# Patient Record
Sex: Male | Born: 1990 | Race: Black or African American | Hispanic: No | Marital: Single | Smoking: Former smoker
Health system: Southern US, Community
[De-identification: ages and names within clinical notes are randomized; demographics above are authoritative.]

## PROBLEM LIST (undated history)

## (undated) HISTORY — PX: ANKLE SURGERY: SHX546

---
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
## 2010-01-27 ENCOUNTER — Emergency Department (HOSPITAL_COMMUNITY): Admission: EM | Admit: 2010-01-27 | Discharge: 2010-01-27 | Payer: Self-pay | Admitting: Family Medicine

## 2010-03-28 ENCOUNTER — Emergency Department (HOSPITAL_COMMUNITY): Admission: EM | Admit: 2010-03-28 | Discharge: 2010-03-28 | Payer: Self-pay | Admitting: Family Medicine

## 2010-11-05 ENCOUNTER — Emergency Department (HOSPITAL_COMMUNITY)
Admission: EM | Admit: 2010-11-05 | Discharge: 2010-11-05 | Disposition: A | Discharge status: Home / Self Care | Payer: Worker's Compensation | Attending: Emergency Medicine | Admitting: Emergency Medicine

## 2010-11-05 ENCOUNTER — Emergency Department (HOSPITAL_COMMUNITY): Payer: Worker's Compensation

## 2010-11-05 DIAGNOSIS — W268XXA Contact with other sharp object(s), not elsewhere classified, initial encounter: Secondary | ICD-10-CM | POA: Insufficient documentation

## 2010-11-05 DIAGNOSIS — Y9269 Other specified industrial and construction area as the place of occurrence of the external cause: Secondary | ICD-10-CM | POA: Insufficient documentation

## 2010-11-05 DIAGNOSIS — S91309A Unspecified open wound, unspecified foot, initial encounter: Secondary | ICD-10-CM | POA: Insufficient documentation

## 2010-11-05 DIAGNOSIS — Y990 Civilian activity done for income or pay: Secondary | ICD-10-CM | POA: Insufficient documentation

## 2010-11-10 LAB — GC/CHLAMYDIA PROBE AMP, GENITAL: Chlamydia, DNA Probe: POSITIVE — AB

## 2010-11-16 ENCOUNTER — Emergency Department (HOSPITAL_COMMUNITY)
Admission: EM | Admit: 2010-11-16 | Discharge: 2010-11-16 | Disposition: A | Discharge status: Home / Self Care | Payer: Worker's Compensation | Attending: Emergency Medicine | Admitting: Emergency Medicine

## 2010-11-16 DIAGNOSIS — Z4802 Encounter for removal of sutures: Secondary | ICD-10-CM | POA: Insufficient documentation

## 2011-01-12 NOTE — Op Note (Signed)
NAMEGERRALD, BASU NO.:  [id number]   MEDICAL RECORD NO.:  [id number]          PATIENT TYPE:  OIB   LOCATION:  2854                         FACILITY:  [facility]   PHYSICIAN:  Vanita Panda. Magnus Ivan, M.D.DATE OF BIRTH:  11-30-1990   DATE OF PROCEDURE:  10/03/2004  DATE OF DISCHARGE:                                 OPERATIVE REPORT   PREOPERATIVE DIAGNOSIS:  Left ankle Tillaux fracture.   POSTOPERATIVE DIAGNOSIS:  Left ankle Tillaux fracture.   PROCEDURE:  Internal reduction, internal fixation of left ankle Tillaux  fracture.   SURGEON:  Vanita Panda. Magnus Ivan, M.D.   ANESTHESIA:  General.   ANTIBIOTICS:  IV Kefzol 1 g.   ESTIMATED BLOOD LOSS:  Minimal.   TOURNIQUET TIME:  One hour and 11 minutes.   COMPLICATIONS:  None.   INDICATIONS FOR PROCEDURE:  Briefly, the patient is a 20 year old who last  Thursday was playing ball at school.  He slid into a base and sustained an  injury to his left ankle.  After continued pain in that ankle during the  day, he was seen in the emergency room that evening and found to have a left  ankle Tillaux fracture.  This was a large anterior lateral piece, as well as  a minimally-displaced fibula fracture through the growth plate.  This  qualified as a Salter-Harris III fracture.  A CT scan was obtained to verify  the displacement, and there was considerable displacement of the articular  portion.  It was recommended that he undergo an open reduction and internal  fixation of this fracture.  The risks and benefits of this were explained to  Mom and the child, and they agreed to proceed with surgery.  In the  emergency room he was placed in a well-padded splint and given a followup  for surgery today.  He has been nonweightbearing.   DESCRIPTION OF PROCEDURE:  After an informed consent was obtained, the  patient was brought to the operating room and placed supine on the operating  room table.  General anesthesia was  obtained, and then a bump was placed  under his left hip, to allow the leg to internally rotate slightly.  The leg  was prepped and draped with Duraprep and sterile drapes.  An Esmarch was  then used to wrap out the leg, and the tourniquet was inflated to 300 mmHg.  An anterior lateral approach was made, and the skin incision made sharply  with a knife, and carried through the subcutaneous tissue.  Blunt-tipped  dissection scissors were then used to take me down to the level of the  fracture.  Careful retraction was made on the superficial peroneal nerve, as  well as identifying the tendinous and other neurovascular structures.  The  fracture itself was identified and the fracture fragments were cleaned  sharply with a knife.  Irrigation was used to cleanse the ankle of the  fracture hematoma.  The fracture fragment in its entirety could be seen, and  once gentle traction was pulled along the ___________ of the foot, and the  foot was put in a plantar-flexed  position, the fracture fragment was teased  into an anatomic position.  Two K-wires were then placed, traversing the  fracture.  One was put in a 90-degree angle to the fracture itself, and this  was for use of a 4.0 cannulated screw.  When this pin was found to be in  position adequately, fluoroscopy was utilized to show that the reduction was  concentric.  The screw size was measured 48 mm, and a cannulated drill was  used to drill over the pin.  A size 48 mm partially-threaded 4.0 cancellus  screw was then placed across the fracture site.  The fracture fragment was  able to be compressed into place.  Under direct visualization, it appeared  to be anatomic and under fluoroscopic guidance likewise it was anatomic.  All guide pins were then removed.  The wound was then copiously irrigated,  and the superficial tissue was closed with interrupted #2-0 Vicryl suture.  The skin was reapproximated with interrupted #2-0 nylon suture.   Sterile  dressings were applied, and then a well-padded plaster splint was applied as  well.  The tourniquet was deflated at one hour and 11 minutes.  The toes did  pink in nicely.   The patient was awakened, extubated, and taken to the recovery room in  stable condition.      CYB/MEDQ  D:  10/03/2004  T:  10/03/2004  Job:  [id number]

## 2011-01-12 NOTE — Consult Note (Signed)
NAMEJASAUN, CARN NO.:  [id number]   MEDICAL RECORD NO.:  [id number]          PATIENT TYPE:  EMS   LOCATION:  MAJO                         FACILITY:  [facility]   PHYSICIAN:  Vanita Panda. Magnus Ivan, M.D.DATE OF BIRTH:  1990-09-17   DATE OF CONSULTATION:  09/28/2004  DATE OF DISCHARGE:  09/28/2004                                   CONSULTATION   REASON FOR CONSULTATION:  Left ankle fracture.   HISTORY OF PRESENT ILLNESS:  Briefly, Ramesses is a 20 year old who while he  was at school today slid into a base during physical education.  He had  immediate pain in his left ankle with difficulty ambulating.  As the course  of the day went on, he continued to have difficulty ambulating.  He was  brought to urgent care by his mother.  X-rays were obtained and showed a  left ankle fracture with an injury through the growth plate.  He was sent  down to the [facility] Emergency Department for CT scan evaluation per my  recommendations once they gave me a call.  In the [facility] emergency room,  he described pain in his ankle with difficulty ambulating.  He denied any  other injuries.  He denied any knee pain.  He denied any numbness and  tingling in that left ankle other than pain and swelling.   PAST MEDICAL HISTORY:  None.   ALLERGIES:  No known drug allergies.   MEDICATIONS:  None.   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   REVIEW OF SYSTEMS:  Negative for chest pain, shortness of breath, nausea,  vomiting, fever or chills, only positive for his left ankle symptoms.   PHYSICAL EXAMINATION:  VITAL SIGNS:  Afebrile, vital signs stable.  GENERAL:  Alert and oriented male in no acute distress or obvious  discomfort.  HEENT:  Normocephalic and atraumatic.  Pupils equal, round and reactive to  light.  NECK:  No tenderness to palpation.  LUNGS:  Clear to auscultation bilaterally.  HEART:  Regular rate and rhythm.  ABDOMEN:  Soft and nontender,  nondistended.  Normal active bowel sounds.  EXTREMITIES:  Swelling over the anterior and lateral left ankle.  The skin  is intact. There is a moderate amount of swelling.  He has a palpable  dorsalis pedis and posterior tibial pulses and good capillary refill in his  toes.  He has normal sensation over his foot and only has pain with  swelling.  He does move his toes but declines to move ankle secondary to  pain.   X-rays were reviewed and show what appears to be a left ankle Tillaux  fracture.  This is a Marzetta Merino 3 fracture extending from the joint  through the epiphysis and out the lateral aspect.   A CT scan was obtained with reconstructions and did showed the  aforementioned Tillaux fracture. This was through the lateral growth plate  which was not yet fused. It does show fusion of his medial growth plate at  the distal ankle.  There is no other noted fragment  other than the fracture  at the physis of the distal fibula; however, this is minimally displaced.   ASSESSMENT:  This is a 20 year old with a left ankle Tillaux fracture.   PLAN:  I am going to place him in a well-padded short leg splint with the  posterior aspect in stirrups.  I will have him remain nonweightbearing  on  crutches.  I have explained to his mother and to him that this will need  surgical intervention due to the displaced intra-articular nature of the  fracture.  I have told him this will be likely through an anterolateral  approach with a single screw to reduce this piece.  I want him to keep his  ankle elevated at all times as much as possible over the weekend.  He will  follow up in my clinic Monday where we will set up surgery as an outpatient  some time Tuesday through Friday.  He was discharged on appropriate pain  medications orally and demonstrated the use of crutches.  Numbers were given  to get hold of my clinic for scheduling appointment.      CYB/MEDQ  D:  09/28/2004  T:  09/29/2004  Job:   [id number]

## 2011-05-02 ENCOUNTER — Emergency Department (HOSPITAL_COMMUNITY)
Admission: EM | Admit: 2011-05-02 | Discharge: 2011-05-03 | Disposition: A | Discharge status: Home / Self Care | Payer: Self-pay | Attending: Emergency Medicine | Admitting: Emergency Medicine

## 2011-05-02 ENCOUNTER — Emergency Department (HOSPITAL_COMMUNITY): Payer: Self-pay

## 2011-05-02 DIAGNOSIS — M545 Low back pain, unspecified: Secondary | ICD-10-CM | POA: Insufficient documentation

## 2011-05-02 DIAGNOSIS — M549 Dorsalgia, unspecified: Secondary | ICD-10-CM | POA: Insufficient documentation

## 2011-05-02 DIAGNOSIS — R0602 Shortness of breath: Secondary | ICD-10-CM | POA: Insufficient documentation

## 2011-05-02 DIAGNOSIS — T148XXA Other injury of unspecified body region, initial encounter: Secondary | ICD-10-CM | POA: Insufficient documentation

## 2011-05-02 DIAGNOSIS — X58XXXA Exposure to other specified factors, initial encounter: Secondary | ICD-10-CM | POA: Insufficient documentation

## 2011-05-05 ENCOUNTER — Emergency Department (HOSPITAL_COMMUNITY)
Admission: EM | Admit: 2011-05-05 | Discharge: 2011-05-05 | Disposition: A | Discharge status: Home / Self Care | Payer: Self-pay | Attending: Emergency Medicine | Admitting: Emergency Medicine

## 2011-05-05 DIAGNOSIS — X58XXXA Exposure to other specified factors, initial encounter: Secondary | ICD-10-CM | POA: Insufficient documentation

## 2011-05-05 DIAGNOSIS — M545 Low back pain, unspecified: Secondary | ICD-10-CM | POA: Insufficient documentation

## 2011-05-05 DIAGNOSIS — M62838 Other muscle spasm: Secondary | ICD-10-CM | POA: Insufficient documentation

## 2011-05-05 DIAGNOSIS — S335XXA Sprain of ligaments of lumbar spine, initial encounter: Secondary | ICD-10-CM | POA: Insufficient documentation

## 2011-05-05 LAB — URINALYSIS, ROUTINE W REFLEX MICROSCOPIC
Glucose, UA: NEGATIVE mg/dL
Protein, ur: NEGATIVE mg/dL
pH: 6 (ref 5.0–8.0)

## 2011-10-09 ENCOUNTER — Emergency Department (HOSPITAL_COMMUNITY)
Admission: EM | Admit: 2011-10-09 | Discharge: 2011-10-10 | Disposition: A | Discharge status: Home / Self Care | Payer: Self-pay | Attending: Emergency Medicine | Admitting: Emergency Medicine

## 2011-10-09 ENCOUNTER — Emergency Department (HOSPITAL_COMMUNITY): Payer: Self-pay

## 2011-10-09 ENCOUNTER — Encounter (HOSPITAL_COMMUNITY): Payer: Self-pay | Admitting: *Deleted

## 2011-10-09 DIAGNOSIS — X500XXA Overexertion from strenuous movement or load, initial encounter: Secondary | ICD-10-CM | POA: Insufficient documentation

## 2011-10-09 DIAGNOSIS — S82899A Other fracture of unspecified lower leg, initial encounter for closed fracture: Secondary | ICD-10-CM | POA: Insufficient documentation

## 2011-10-09 DIAGNOSIS — M25579 Pain in unspecified ankle and joints of unspecified foot: Secondary | ICD-10-CM | POA: Insufficient documentation

## 2011-10-09 NOTE — ED Notes (Signed)
Patient transported to X-ray 

## 2011-10-09 NOTE — ED Notes (Signed)
Patient injured his left ankle last night playing basket ball.  Patient was able to work out at [employer] today and tonight when he got in the bed it was swollen and pain was worse.  Has history of ankle fracture on that right ankle

## 2011-10-10 MED ORDER — IBUPROFEN 800 MG PO TABS: 800.0000 mg | ORAL_TABLET | Freq: Three times a day (TID) | ORAL | Status: AC

## 2011-10-10 MED ORDER — OXYCODONE-ACETAMINOPHEN 5-325 MG PO TABS: 2.0000 | ORAL_TABLET | ORAL | Status: AC | PRN

## 2011-10-10 NOTE — ED Provider Notes (Signed)
History     CSN: [id number]  Arrival date & time 10/09/11  2329   None     Chief Complaint  Patient presents with  . Ankle Pain    (Consider location/radiation/quality/duration/timing/severity/associated sxs/prior treatment) Patient is a 21 y.o. male presenting with ankle pain. The history is provided by the patient. No language interpreter was used.  Ankle Pain  The pain is present in the left ankle and left foot. The quality of the pain is described as throbbing. The pain is at a severity of 6/10. The pain is moderate. The pain has been constant since onset. Pertinent negatives include no numbness, no inability to bear weight, no loss of motion, no muscle weakness and no loss of sensation. The symptoms are aggravated by bearing weight. He has tried nothing for the symptoms.    History reviewed. No pertinent past medical history.  History reviewed. No pertinent past surgical history.  History reviewed. No pertinent family history.  History  Substance Use Topics  . Smoking status: Never Smoker   . Smokeless tobacco: Not on file  . Alcohol Use: No      Review of Systems  Neurological: Negative for numbness.  All other systems reviewed and are negative.    Allergies  Review of patient's allergies indicates no known allergies.  Home Medications  No current outpatient prescriptions on file.  BP 147/91  Pulse 93  Temp(Src) 98.8 F (37.1 C) (Oral)  Resp 20  SpO2 100%  Physical Exam  Nursing note and vitals reviewed. Constitutional: He is oriented to person, place, and time. He appears well-developed and well-nourished.  HENT:  Head: Normocephalic and atraumatic.  Eyes: Pupils are equal, round, and reactive to light.  Neck: Neck supple.  Cardiovascular: Normal rate and regular rhythm.  Exam reveals no gallop and no friction rub.   No murmur heard. Pulmonary/Chest: Breath sounds normal. No respiratory distress.  Abdominal: Soft. He exhibits no distension.    Musculoskeletal: Normal range of motion.       L ankle pain and swelling 2+ pedal pulse with good sensation.  Neurological: He is alert and oriented to person, place, and time. No cranial nerve deficit.  Skin: Skin is warm and dry.  Psychiatric: He has a normal mood and affect.    ED Course  Procedures (including critical care time)  Labs Reviewed - No data to display Dg Ankle Complete Left  10/10/2011  *RADIOLOGY REPORT*  Clinical Data: Lateral pain and swelling after twisting injury on 02/11.  LEFT ANKLE COMPLETE - 3+ VIEW  Comparison: Left foot 11/05/2010.  Left ankle 09/28/2004  Findings: Interval screw fixation of the distal left tibia with healing of previously demonstrated fractures.  Acute appearing fragment inferior to the lateral malleolus with associated soft tissue swelling suggesting avulsion fracture.  Left ankle otherwise appears intact.  No focal bone lesion or bone destruction.  No abnormal radiopaque foreign bodies in the soft tissue.  IMPRESSION: Acute appearing bone fragment inferior to the lateral malleolus with overlying soft tissue swelling consistent with avulsion fracture.  Original Report Authenticated By: Marlon Pel, M.D.     No diagnosis found.    MDM  Avulsion fx to interior lateral malleolus.  Cam walker provided with ortho follow up this week and pain meds.        Jethro Bastos, NP 10/12/11 1712

## 2011-10-10 NOTE — Discharge Instructions (Signed)
Ankle Fracture A fracture is a break in the bone. A cast or splint is used to protect and keep your injured bone from moving.  HOME CARE INSTRUCTIONS   Use your crutches as directed.   To lessen the swelling, keep the injured leg elevated while sitting or lying down.   Apply ice to the injury for 15 to 20 minutes, 3 to 4 times per day while awake for 2 days. Put the ice in a plastic bag and place a thin towel between the bag of ice and your cast.   If you have a plaster or fiberglass cast:   Do not try to scratch the skin under the cast using sharp or pointed objects.   Check the skin around the cast every day. You may put lotion on any red or sore areas.   Keep your cast dry and clean.   If you have a plaster splint:   Wear the splint as directed.   You may loosen the elastic around the splint if your toes become numb, tingle, or turn cold or blue.   Do not put pressure on any part of your cast or splint; it may break. Rest your cast only on a pillow the first 24 hours until it is fully hardened.   Your cast or splint can be protected during bathing with a plastic bag. Do not lower the cast or splint into water.   Take medications as directed by your caregiver. Only take over-the-counter or prescription medicines for pain, discomfort, or fever as directed by your caregiver.   Do not drive a vehicle until your caregiver specifically tells you it is safe to do so.   If your caregiver has given you a follow-up appointment, it is very important to keep that appointment. Not keeping the appointment could result in a chronic or permanent injury, pain, and disability. If there is any problem keeping the appointment, you must call back to this facility for assistance.  SEEK IMMEDIATE MEDICAL CARE IF:   Your cast gets damaged or breaks.   You have continued severe pain or more swelling than you did before the cast was put on.   Your skin or toenails below the injury turn blue or gray,  or feel cold or numb.   There is a bad smell or new stains and/or purulent (pus like) drainage coming from under the cast.  If you do not have a window in your cast for observing the wound, a discharge or minor bleeding may show up as a stain on the outside of your cast. Report these findings to your caregiver. MAKE SURE YOU:   Understand these instructions.   Will watch your condition.   Will get help right away if you are not doing well or get worse.  Document Released: 08/10/2000 Document Revised: 04/25/2011 Document Reviewed: 03/16/2008 [facility] Patient Information 2012 [place], [place].Ankle Fracture A fracture is a break in the bone. A cast or splint is used to protect and keep your injured bone from moving.  HOME CARE INSTRUCTIONS   Use your crutches as directed.   To lessen the swelling, keep the injured leg elevated while sitting or lying down.   Apply ice to the injury for 15 to 20 minutes, 3 to 4 times per day while awake for 2 days. Put the ice in a plastic bag and place a thin towel between the bag of ice and your cast.   If you have a plaster or fiberglass cast:  Do not try to scratch the skin under the cast using sharp or pointed objects.   Check the skin around the cast every day. You may put lotion on any red or sore areas.   Keep your cast dry and clean.   If you have a plaster splint:   Wear the splint as directed.   You may loosen the elastic around the splint if your toes become numb, tingle, or turn cold or blue.   Do not put pressure on any part of your cast or splint; it may break. Rest your cast only on a pillow the first 24 hours until it is fully hardened.   Your cast or splint can be protected during bathing with a plastic bag. Do not lower the cast or splint into water.   Take medications as directed by your caregiver. Only take over-the-counter or prescription medicines for pain, discomfort, or fever as directed by your caregiver.   Do not drive  a vehicle until your caregiver specifically tells you it is safe to do so.   If your caregiver has given you a follow-up appointment, it is very important to keep that appointment. Not keeping the appointment could result in a chronic or permanent injury, pain, and disability. If there is any problem keeping the appointment, you must call back to this facility for assistance.  SEEK IMMEDIATE MEDICAL CARE IF:   Your cast gets damaged or breaks.   You have continued severe pain or more swelling than you did before the cast was put on.   Your skin or toenails below the injury turn blue or gray, or feel cold or numb.   There is a bad smell or new stains and/or purulent (pus like) drainage coming from under the cast.  If you do not have a window in your cast for observing the wound, a discharge or minor bleeding may show up as a stain on the outside of your cast. Report these findings to your caregiver. MAKE SURE YOU:   Understand these instructions.   Will watch your condition.   Will get help right away if you are not doing well or get worse.  Document Released: 08/10/2000 Document Revised: 04/25/2011 Document Reviewed: 03/16/2008 [facility] Patient Information 2012 [place], [place].

## 2011-10-13 NOTE — ED Provider Notes (Signed)
Medical screening examination/treatment/procedure(s) were performed by non-physician practitioner and as supervising physician I was immediately available for consultation/collaboration.   Nika Yazzie, MD 10/13/11 0129 

## 2012-06-22 ENCOUNTER — Emergency Department (HOSPITAL_COMMUNITY)
Admission: EM | Admit: 2012-06-22 | Discharge: 2012-06-22 | Disposition: A | Discharge status: Home / Self Care | Payer: Self-pay | Attending: Emergency Medicine | Admitting: Emergency Medicine

## 2012-06-22 ENCOUNTER — Encounter (HOSPITAL_COMMUNITY): Payer: Self-pay | Admitting: *Deleted

## 2012-06-22 DIAGNOSIS — K089 Disorder of teeth and supporting structures, unspecified: Secondary | ICD-10-CM | POA: Insufficient documentation

## 2012-06-22 DIAGNOSIS — K0889 Other specified disorders of teeth and supporting structures: Secondary | ICD-10-CM

## 2012-06-22 MED ORDER — PENICILLIN V POTASSIUM 500 MG PO TABS: 500.0000 mg | ORAL_TABLET | Freq: Four times a day (QID) | ORAL | Status: DC

## 2012-06-22 NOTE — ED Notes (Signed)
Pt reports that he had a toothache this afternoon and went to take Ibuprofen; pt believed that he was taking 8 100mg  tablets but after taking them realized that they were 200mg  tablets; pt denies suicidal ideation; denies N/V/D.

## 2012-06-22 NOTE — ED Provider Notes (Signed)
Medical screening examination/treatment/procedure(s) were performed by non-physician practitioner and as supervising physician I was immediately available for consultation/collaboration.  Loretta Kluender T Jesselle Laflamme, MD 06/22/12 2239 

## 2012-06-22 NOTE — ED Provider Notes (Signed)
History     CSN: [id number]  Arrival date & time 06/22/12  2058   First MD Initiated Contact with Patient 06/22/12 2137      Chief Complaint  Patient presents with  . Drug Overdose    (Consider location/radiation/quality/duration/timing/severity/associated sxs/prior treatment) HPI Comments: This is a 21 year old male, who presents to the emergency department with chief complaint of tooth pain. Additionally, the patient states that he took too much ibuprofen. He says that he thought he was taking 100 mg tablets, but that he is actually taking 200 mg tablets for a total of 1600 mg. Patient does not have any nausea, vomiting, abdominal pain. He is asymptomatic in general except for his tooth pain. He states that his tooth pain is moderate. The pain does not radiate.  The history is provided by the patient. No language interpreter was used.    History reviewed. No pertinent past medical history.  Past Surgical History  Procedure Date  . Ankle surgery     No family history on file.  History  Substance Use Topics  . Smoking status: Never Smoker   . Smokeless tobacco: Not on file  . Alcohol Use: No      Review of Systems  HENT:       Toothache  Gastrointestinal: Negative for nausea, vomiting, abdominal pain, diarrhea, constipation, blood in stool, abdominal distention, anal bleeding and rectal pain.  All other systems reviewed and are negative.    Allergies  Review of patient's allergies indicates no known allergies.  Home Medications  No current outpatient prescriptions on file.  BP 146/79  Pulse 99  Temp 98.5 F (36.9 C) (Oral)  Resp 16  Ht 6\' 1"  (1.854 m)  Wt 278 lb 3.2 oz (126.191 kg)  BMI 36.70 kg/m2  SpO2 100%  Physical Exam  Nursing note and vitals reviewed. Constitutional: He is oriented to person, place, and time. He appears well-developed and well-nourished.  HENT:  Head: Normocephalic and atraumatic.  Eyes: Conjunctivae normal and EOM are  normal. Pupils are equal, round, and reactive to light.  Neck: Normal range of motion. Neck supple.  Cardiovascular: Normal rate, regular rhythm and normal heart sounds.   Pulmonary/Chest: Effort normal and breath sounds normal.  Abdominal: Soft. Bowel sounds are normal.  Musculoskeletal: Normal range of motion.  Neurological: He is alert and oriented to person, place, and time.  Skin: Skin is warm and dry.  Psychiatric: He has a normal mood and affect. His behavior is normal. Judgment and thought content normal.    ED Course  Procedures (including critical care time)  Labs Reviewed - No data to display No results found.   1. Toothache       MDM  21 year old male with toothache. I'm going to discharge the patient with penicillin and instructions to followup with a dentist. The patient also ingested 1600 mg of ibuprofen, but I'm not concerned about toxicity based on the patient's weight and the amount of ibuprofen the was ingested. The patient is stable and ready for discharge.        Roxy Horseman, PA-C 06/22/12 2150

## 2013-09-28 ENCOUNTER — Emergency Department (HOSPITAL_COMMUNITY)
Admission: EM | Admit: 2013-09-28 | Discharge: 2013-09-28 | Discharge status: Against Medical Advice | Payer: Worker's Compensation | Source: Home / Self Care

## 2013-09-28 NOTE — ED Notes (Signed)
Presumed to have left UCC w/o exam when unable to locate pt at 18:19

## 2013-11-27 ENCOUNTER — Emergency Department (HOSPITAL_COMMUNITY)
Admission: EM | Admit: 2013-11-27 | Discharge: 2013-11-27 | Disposition: A | Discharge status: Home / Self Care | Payer: Worker's Compensation | Attending: Emergency Medicine | Admitting: Emergency Medicine

## 2013-11-27 ENCOUNTER — Encounter (HOSPITAL_COMMUNITY): Payer: Self-pay | Admitting: Emergency Medicine

## 2013-11-27 DIAGNOSIS — W1809XA Striking against other object with subsequent fall, initial encounter: Secondary | ICD-10-CM | POA: Insufficient documentation

## 2013-11-27 DIAGNOSIS — IMO0002 Reserved for concepts with insufficient information to code with codable children: Secondary | ICD-10-CM | POA: Insufficient documentation

## 2013-11-27 DIAGNOSIS — Y990 Civilian activity done for income or pay: Secondary | ICD-10-CM | POA: Insufficient documentation

## 2013-11-27 DIAGNOSIS — X500XXA Overexertion from strenuous movement or load, initial encounter: Secondary | ICD-10-CM | POA: Insufficient documentation

## 2013-11-27 DIAGNOSIS — S93409A Sprain of unspecified ligament of unspecified ankle, initial encounter: Secondary | ICD-10-CM | POA: Insufficient documentation

## 2013-11-27 DIAGNOSIS — Y9229 Other specified public building as the place of occurrence of the external cause: Secondary | ICD-10-CM | POA: Insufficient documentation

## 2013-11-27 DIAGNOSIS — Y9389 Activity, other specified: Secondary | ICD-10-CM | POA: Insufficient documentation

## 2013-11-27 DIAGNOSIS — S93402A Sprain of unspecified ligament of left ankle, initial encounter: Secondary | ICD-10-CM

## 2013-11-27 MED ORDER — IBUPROFEN 800 MG PO TABS: 800.0000 mg | ORAL_TABLET | Freq: Three times a day (TID) | ORAL | Status: DC

## 2013-11-27 MED ORDER — METHOCARBAMOL 500 MG PO TABS: 500.0000 mg | ORAL_TABLET | Freq: Two times a day (BID) | ORAL | Status: DC

## 2013-11-27 NOTE — Progress Notes (Signed)
Orthopedic Tech Progress Note Patient Details:  Keith Foster 11/24/1990 [id number]  Ortho Devices Type of Ortho Device: ASO Ortho Device/Splint Location: Keith Foster   Keith Foster 11/27/2013, 10:17 PM

## 2013-11-27 NOTE — ED Notes (Signed)
Pt. reports injury to left ankle at work this morning ( [employer]) , slipped on icy floor inside freezer and twisted his left ankle with superficial laceration approx. 1 inch at left shin . Ambulatory.

## 2013-11-27 NOTE — Discharge Instructions (Signed)
Acute Ankle Sprain  with Phase I Rehab  An acute ankle sprain is a partial or complete tear in one or more of the ligaments of the ankle due to traumatic injury. The severity of the injury depends on both the the number of ligaments sprained and the grade of sprain. There are 3 grades of sprains.   · A grade 1 sprain is a mild sprain. There is a slight pull without obvious tearing. There is no loss of strength, and the muscle and ligament are the correct length.  · A grade 2 sprain is a moderate sprain. There is tearing of fibers within the substance of the ligament where it connects two bones or two cartilages. The length of the ligament is increased, and there is usually decreased strength.  · A grade 3 sprain is a complete rupture of the ligament and is uncommon.  In addition to the grade of sprain, there are three types of ankle sprains.   Lateral ankle sprains: This is a sprain of one or more of the three ligaments on the outer side (lateral) of the ankle. These are the most common sprains.  Medial ankle sprains: There is one large triangular ligament of the inner side (medial) of the ankle that is susceptible to injury. Medial ankle sprains are less common.  Syndesmosis, "high ankle," sprains: The syndesmosis is the ligament that connects the two bones of the lower leg. Syndesmosis sprains usually only occur with very severe ankle sprains.  SYMPTOMS  · Pain, tenderness, and swelling in the ankle, starting at the side of injury that may progress to the whole ankle and foot with time.  · "Pop" or tearing sensation at the time of injury.  · Bruising that may spread to the heel.  · Impaired ability to walk soon after injury.  CAUSES   · Acute ankle sprains are caused by trauma placed on the ankle that temporarily forces or pries the anklebone (talus) out of its normal socket.  · Stretching or tearing of the ligaments that normally hold the joint in place (usually due to a twisting injury).  RISK INCREASES  WITH:  · Previous ankle sprain.  · Sports in which the foot may land awkwardly (ie. basketball, volleyball, or soccer) or walking or running on uneven or rough surfaces.  · Shoes with inadequate support to prevent sideways motion when stress occurs.  · Poor strength and flexibility.  · Poor balance skills.  · Contact sports.  PREVENTION   · Warm up and stretch properly before activity.  · Maintain physical fitness:  · Ankle and leg flexibility, muscle strength, and endurance.  · Cardiovascular fitness.  · Balance training activities.  · Use proper technique and have a coach correct improper technique.  · Taping, protective strapping, bracing, or high-top tennis shoes may help prevent injury. Initially, tape is best; however, it loses most of its support function within 10 to 15 minutes.  · Wear proper fitted protective shoes (High-top shoes with taping or bracing is more effective than either alone).  · Provide the ankle with support during sports and practice activities for 12 months following injury.  PROGNOSIS   · If treated properly, ankle sprains can be expected to recover completely; however, the length of recovery depends on the degree of injury.  · A grade 1 sprain usually heals enough in 5 to 7 days to allow modified activity and requires an average of 6 weeks to heal completely.  · A grade 2 sprain requires   6 to 10 weeks to heal completely.  · A grade 3 sprain requires 12 to 16 weeks to heal.  · A syndesmosis sprain often takes more than 3 months to heal.  RELATED COMPLICATIONS   · Frequent recurrence of symptoms may result in a chronic problem. Appropriately addressing the problem the first time decreases the frequency of recurrence and optimizes healing time. Severity of the initial sprain does not predict the likelihood of later instability.  · Injury to other structures (bone, cartilage, or tendon).  · A chronically unstable or arthritic ankle joint is a possiblity with repeated  sprains.  TREATMENT  Treatment initially involves the use of ice, medication, and compression bandages to help reduce pain and inflammation. Ankle sprains are usually immobilized in a walking cast or boot to allow for healing. Crutches may be recommended to reduce pressure on the injury. After immobilization, strengthening and stretching exercises may be necessary to regain strength and a full range of motion. Surgery is rarely needed to treat ankle sprains.  MEDICATION   · Nonsteroidal anti-inflammatory medications, such as aspirin and ibuprofen (do not take for the first 3 days after injury or within 7 days before surgery), or other minor pain relievers, such as acetaminophen, are often recommended. Take these as directed by your caregiver. Contact your caregiver immediately if any bleeding, stomach upset, or signs of an allergic reaction occur from these medications.  · Ointments applied to the skin may be helpful.  · Pain relievers may be prescribed as necessary by your caregiver. Do not take prescription pain medication for longer than 4 to 7 days. Use only as directed and only as much as you need.  HEAT AND COLD  · Cold treatment (icing) is used to relieve pain and reduce inflammation for acute and chronic cases. Cold should be applied for 10 to 15 minutes every 2 to 3 hours for inflammation and pain and immediately after any activity that aggravates your symptoms. Use ice packs or an ice massage.  · Heat treatment may be used before performing stretching and strengthening activities prescribed by your caregiver. Use a heat pack or a warm soak.  SEEK IMMEDIATE MEDICAL CARE IF:   · Pain, swelling, or bruising worsens despite treatment.  · You experience pain, numbness, discoloration, or coldness in the foot or toes.  · New, unexplained symptoms develop (drugs used in treatment may produce side effects.)  EXERCISES   PHASE I EXERCISES  RANGE OF MOTION (ROM) AND STRETCHING EXERCISES - Ankle Sprain, Acute Phase I,  Weeks 1 to 2  These exercises may help you when beginning to restore flexibility in your ankle. You will likely work on these exercises for the 1 to 2 weeks after your injury. Once your physician, physical therapist, or athletic trainer sees adequate progress, he or she will advance your exercises. While completing these exercises, remember:   · Restoring tissue flexibility helps normal motion to return to the joints. This allows healthier, less painful movement and activity.  · An effective stretch should be held for at least 30 seconds.  · A stretch should never be painful. You should only feel a gentle lengthening or release in the stretched tissue.  RANGE OF MOTION - Dorsi/Plantar Flexion  · While sitting with your right / left knee straight, draw the top of your foot upwards by flexing your ankle. Then reverse the motion, pointing your toes downward.  · Hold each position for __________ seconds.  · After completing your first set of   exercises, repeat this exercise with your knee bent.  Repeat __________ times. Complete this exercise __________ times per day.   RANGE OF MOTION - Ankle Alphabet  · Imagine your right / left big toe is a pen.  · Keeping your hip and knee still, write out the entire alphabet with your "pen." Make the letters as large as you can without increasing any discomfort.  Repeat __________ times. Complete this exercise __________ times per day.   STRENGTHENING EXERCISES - Ankle Sprain, Acute -Phase I, Weeks 1 to 2  These exercises may help you when beginning to restore strength in your ankle. You will likely work on these exercises for 1 to 2 weeks after your injury. Once your physician, physical therapist, or athletic trainer sees adequate progress, he or she will advance your exercises. While completing these exercises, remember:   · Muscles can gain both the endurance and the strength needed for everyday activities through controlled exercises.  · Complete these exercises as instructed by  your physician, physical therapist, or athletic trainer. Progress the resistance and repetitions only as guided.  · You may experience muscle soreness or fatigue, but the pain or discomfort you are trying to eliminate should never worsen during these exercises. If this pain does worsen, stop and make certain you are following the directions exactly. If the pain is still present after adjustments, discontinue the exercise until you can discuss the trouble with your clinician.  STRENGTH - Dorsiflexors  · Secure a rubber exercise band/tubing to a fixed object (ie. table, pole) and loop the other end around your right / left foot.  · Sit on the floor facing the fixed object. The band/tubing should be slightly tense when your foot is relaxed.  · Slowly draw your foot back toward you using your ankle and toes.  · Hold this position for __________ seconds. Slowly release the tension in the band and return your foot to the starting position.  Repeat __________ times. Complete this exercise __________ times per day.   STRENGTH - Plantar-flexors   · Sit with your right / left leg extended. Holding onto both ends of a rubber exercise band/tubing, loop it around the ball of your foot. Keep a slight tension in the band.  · Slowly push your toes away from you, pointing them downward.  · Hold this position for __________ seconds. Return slowly, controlling the tension in the band/tubing.  Repeat __________ times. Complete this exercise __________ times per day.   STRENGTH - Ankle Eversion  · Secure one end of a rubber exercise band/tubing to a fixed object (table, pole). Loop the other end around your foot just before your toes.  · Place your fists between your knees. This will focus your strengthening at your ankle.  · Drawing the band/tubing across your opposite foot, slowly, pull your little toe out and up. Make sure the band/tubing is positioned to resist the entire motion.  · Hold this position for __________ seconds.  Have  your muscles resist the band/tubing as it slowly pulls your foot back to the starting position.   Repeat __________ times. Complete this exercise __________ times per day.   STRENGTH - Ankle Inversion  · Secure one end of a rubber exercise band/tubing to a fixed object (table, pole). Loop the other end around your foot just before your toes.  · Place your fists between your knees. This will focus your strengthening at your ankle.  · Slowly, pull your big toe up and in, making   sure the band/tubing is positioned to resist the entire motion.  · Hold this position for __________ seconds.  · Have your muscles resist the band/tubing as it slowly pulls your foot back to the starting position.  Repeat __________ times. Complete this exercises __________ times per day.   STRENGTH - Towel Curls  · Sit in a chair positioned on a non-carpeted surface.  · Place your right / left foot on a towel, keeping your heel on the floor.  · Pull the towel toward your heel by only curling your toes. Keep your heel on the floor.  · If instructed by your physician, physical therapist, or athletic trainer, add weight to the end of the towel.  Repeat __________ times. Complete this exercise __________ times per day.  Document Released: 03/14/2005 Document Revised: 11/05/2011 Document Reviewed: 11/25/2008  [employer]® Patient Information ©2014 [employer].

## 2013-11-27 NOTE — ED Provider Notes (Signed)
CSN: [id number]     Arrival date & time 11/27/13  2047 History  This chart was scribed for Keith HelperBowie Donnesha Karg, PA-C, working with Junius ArgyleForrest S Harrison, MD, by [facility] ED Scribe. This patient was seen in room TR07C/TR07C and the patient's care was started at 9:39 PM.   Chief Complaint  Patient presents with  . Ankle Pain    The history is provided by the patient. No language interpreter was used.    HPI Comments: Keith Foster is a 23 y.o. Male with a prior history of left ankle surgery who presents to the Emergency Department complaining of a left ankle injury that occurred about 12 hours ago. Pt states that he slipped on an icy floor in a freezer and twisted his left ankle. He states that he had to stay at work after the injury and was on the foot for about 4 hours after the injury. He states that his pain is worsened with walking and weightbearing. He also states that he hit his left shin upon falling, and that he sustained a small laceration to his anterior left shin upon falling. He states that his last Tetanus vaccination was about 2 years ago. He reports that he did not sustain any other injuries at the time of the fall. He denies knee or hip pain or any other symptoms.   History reviewed. No pertinent past medical history. Past Surgical History  Procedure Laterality Date  . Ankle surgery     No family history on file. History  Substance Use Topics  . Smoking status: Never Smoker   . Smokeless tobacco: Not on file  . Alcohol Use: No    Review of Systems  Musculoskeletal: Positive for arthralgias (left ankle).       Denies hip or knee pain  Skin: Positive for wound.  Neurological: Negative for syncope and headaches.  All other systems reviewed and are negative.   Allergies  Review of patient's allergies indicates no known allergies.  Home Medications   Current Outpatient Rx  Name  Route  Sig  Dispense  Refill  . ibuprofen (ADVIL,MOTRIN) 800 MG tablet   Oral   Take 1  tablet (800 mg total) by mouth 3 (three) times daily.   21 tablet   0   . methocarbamol (ROBAXIN) 500 MG tablet   Oral   Take 1 tablet (500 mg total) by mouth 2 (two) times daily.   20 tablet   0     Triage Vitals: BP 143/81  Pulse 105  Temp(Src) 98 F (36.7 C) (Oral)  Resp 18  SpO2 98%  Physical Exam  Nursing note and vitals reviewed. Constitutional: He is oriented to person, place, and time. He appears well-developed and well-nourished. No distress.  HENT:  Head: Normocephalic and atraumatic.  Eyes: EOM are normal.  Neck: Neck supple. No tracheal deviation present.  Cardiovascular: Normal rate and intact distal pulses.   Distal pulses intact with brisk cap refill.  Pulmonary/Chest: Effort normal. No respiratory distress.  Musculoskeletal: Normal range of motion. He exhibits edema and tenderness.  Left foot: Tenderness to proximal dorsum of foot with palpation. Mild edema noted.  Pain with ankle ROM, but maintains full ROM.  Neurological: He is alert and oriented to person, place, and time.  Skin: Skin is warm and dry.  Left lower leg: a 2 cm superficial abrasion noted to the mid anterior tib/fib. No deformity. Minimal tenderness to palpation.   Psychiatric: He has a normal mood and affect. His behavior  is normal.    ED Course  Procedures (including critical care time)  DIAGNOSTIC STUDIES: Oxygen Saturation is 98% on RA, normal by my interpretation.    COORDINATION OF CARE: 9:45 PM- Discussed clinical suspicion that pt sprained his ankle. Will give pt an ankle brace, high dose ibuprofen and a muscle relaxant. Also advised pt of plan for at home care including elevating his ankle. Will also give pt a work note. Pt advised of plan for treatment and pt agrees.  Labs Review Labs Reviewed - No data to display Imaging Review No results found.   EKG Interpretation None      MDM   Final diagnoses:  Left ankle sprain    BP 143/81  Pulse 105  Temp(Src) 98 F  (36.7 C) (Oral)  Resp 18  SpO2 98%   I personally performed the services described in this documentation, which was scribed in my presence. The recorded information has been reviewed and is accurate.    Keith Helper, PA-C 11/27/13 2151

## 2013-11-27 NOTE — ED Notes (Signed)
Pt st's while at work this am he stepped on a rack that slid backwards causing him to turn his ankle and hit his lower leg on the rack.

## 2013-11-28 NOTE — ED Provider Notes (Signed)
Medical screening examination/treatment/procedure(s) were performed by non-physician practitioner and as supervising physician I was immediately available for consultation/collaboration.   EKG Interpretation None        Nicolemarie Wooley S Hitomi Slape, MD 11/28/13 1245 

## 2014-11-19 ENCOUNTER — Emergency Department (HOSPITAL_COMMUNITY)
Admission: EM | Admit: 2014-11-19 | Discharge: 2014-11-19 | Disposition: A | Discharge status: Home / Self Care | Payer: Worker's Compensation | Attending: Emergency Medicine | Admitting: Emergency Medicine

## 2014-11-19 ENCOUNTER — Encounter (HOSPITAL_COMMUNITY): Payer: Self-pay | Admitting: *Deleted

## 2014-11-19 DIAGNOSIS — M545 Low back pain, unspecified: Secondary | ICD-10-CM

## 2014-11-19 DIAGNOSIS — Z791 Long term (current) use of non-steroidal anti-inflammatories (NSAID): Secondary | ICD-10-CM | POA: Insufficient documentation

## 2014-11-19 DIAGNOSIS — Z79899 Other long term (current) drug therapy: Secondary | ICD-10-CM | POA: Insufficient documentation

## 2014-11-19 MED ORDER — HYDROCODONE-ACETAMINOPHEN 5-325 MG PO TABS
2.0000 | ORAL_TABLET | Freq: Once | ORAL | Status: AC
  Administered 2014-11-19: 2 via ORAL
  Filled 2014-11-19: qty 2

## 2014-11-19 MED ORDER — IBUPROFEN 800 MG PO TABS
800.0000 mg | ORAL_TABLET | Freq: Once | ORAL | Status: AC
  Administered 2014-11-19: 800 mg via ORAL
  Filled 2014-11-19: qty 1

## 2014-11-19 MED ORDER — IBUPROFEN 800 MG PO TABS: 800.0000 mg | ORAL_TABLET | Freq: Three times a day (TID) | ORAL | Status: DC

## 2014-11-19 NOTE — Discharge Instructions (Signed)
Back Injury Prevention Mr. Keith Foster, Continue to take motrin at home as directed for pain and use ice packs as well.  Do not do heavy lifting greater than 30 pounds until your back heals.  Follow up with a primary physician within 3 days for continued management.  If symptoms worsen, come back to the ED immediately. Thank you.  The following tips can help you to prevent a back injury. PHYSICAL FITNESS  Exercise often. Try to develop strong stomach (abdominal) muscles.  Do aerobic exercises often. This includes walking, jogging, biking, swimming.  Do exercises that help with balance and strength often. This includes tai chi and yoga.  Stretch before and after you exercise.  Keep a healthy weight. DIET   Ask your doctor how much calcium and vitamin D you need every day.  Include calcium in your diet. Foods high in calcium include dairy products; green, leafy vegetables; and products with calcium added (fortified).  Include vitamin D in your diet. Foods high in vitamin D include milk and products with vitamin D added.  Think about taking a multivitamin or other nutritional products called " supplements."  Stop smoking if you smoke. POSTURE   Sit and stand up straight. Avoid leaning forward or hunching over.  Choose chairs that support your lower back.  If you work at a desk:  Sit close to your work so you do not lean over.  Keep your chin tucked in.  Keep your neck drawn back.  Keep your elbows bent at a right angle. Your arms should look like the letter "L."  Sit high and close to the steering wheel when you drive. Add low back support to your car seat if needed.  Avoid sitting or standing in one position for too long. Get up and move around every hour. Take breaks if you are driving for a long time.  Sleep on your side with your knees slightly bent. You can also sleep on your back with a pillow under your knees. Do not sleep on your stomach. LIFTING, TWISTING, AND  REACHING  Avoid heavy lifting, especially lifting over and over again. If you must do heavy lifting:  Stretch before lifting.  Work slowly.  Rest between lifts.  Use carts and dollies to move objects when possible.  Make several small trips instead of carrying 1 heavy load.  Ask for help when you need it.  Ask for help when moving big, awkward objects.  Follow these steps when lifting:  Stand with your feet shoulder-width apart.  Get as close to the object as you can. Do not pick up heavy objects that are far from your body.  Use handles or lifting straps when possible.  Bend at your knees. Squat down, but keep your heels off the floor.  Keep your shoulders back, your chin tucked in, and your back straight.  Lift the object slowly. Tighten the muscles in your legs, stomach, and butt. Keep the object as close to the center of your body as possible.  Reverse these directions when you put a load down.  Do not:  Lift the object above your waist.  Twist at the waist while lifting or carrying a load. Move your feet if you need to turn, not your waist.  Bend over without bending at your knees.  Avoid reaching over your head, across a table, or for an object on a high surface. OTHER TIPS  Avoid wet floors and keep sidewalks clear of ice.  Do not sleep on  a mattress that is too soft or too hard.  Keep items that you use often within easy reach.  Put heavier objects on shelves at waist level. Put lighter objects on lower or higher shelves.  Find ways to lessen your stress. You can try exercise, massage, or relaxation.  Get help for depression or anxiety if needed. GET HELP IF:  You injure your back.  You have questions about diet, exercise, or other ways to prevent back injuries. MAKE SURE YOU:  Understand these instructions.  Will watch your condition.  Will get help right away if you are not doing well or get worse. Document Released: 01/30/2008 Document  Revised: 11/05/2011 Document Reviewed: 09/24/2011 [facility] Patient Information 2015 [place], [place]. This information is not intended to replace advice given to you by your health care provider. Make sure you discuss any questions you have with your health care provider.

## 2014-11-19 NOTE — ED Notes (Signed)
Pt c/o left lower back pain 8/10. Pt denies any urinary symptoms.

## 2014-11-19 NOTE — ED Provider Notes (Signed)
CSN: [id number]     Arrival date & time 11/19/14  0304 History  This chart was scribed for Tomasita Crumble, MD by Evon Slack, ED Scribe. This patient was seen in room A03C/A03C and the patient's care was started at 3:56 AM.    Chief Complaint  Patient presents with  . Back Pain   The history is provided by the patient. No language interpreter was used.   HPI Comments: Keith Foster is a 24 y.o. male who presents to the Emergency Department complaining of worsening low back pain onset 4 days prior. Pt states that 4 days ago he slept on his mattress wrong. He states that his back was half way off the mattress the entire tonight. The The patient was doing heavy lifting at the gym with his arms and noticed that is making his back pain worse as well. Pt states that certain movements make the pain worse. Pt doesn't report any medications PTA. Pt states that he has had similar pain 2 years prior that was relieved with muscle relaxer's. Pt denies fever or urinary symptoms.   History reviewed. No pertinent past medical history. Past Surgical History  Procedure Laterality Date  . Ankle surgery     No family history on file. History  Substance Use Topics  . Smoking status: Never Smoker   . Smokeless tobacco: Not on file  . Alcohol Use: No    Review of Systems  Constitutional: Negative for fever.  Genitourinary: Negative.   Musculoskeletal: Positive for back pain.  All other systems reviewed and are negative.     Allergies  Review of patient's allergies indicates no known allergies.  Home Medications   Prior to Admission medications   Medication Sig Start Date End Date Taking? Authorizing Provider  ibuprofen (ADVIL,MOTRIN) 800 MG tablet Take 1 tablet (800 mg total) by mouth 3 (three) times daily. 11/27/13   Fayrene Helper, PA-C  methocarbamol (ROBAXIN) 500 MG tablet Take 1 tablet (500 mg total) by mouth 2 (two) times daily. 11/27/13   Fayrene Helper, PA-C   BP 132/74 mmHg  Pulse 81   Temp(Src) 98.4 F (36.9 C) (Oral)  Resp 18  Ht 6' (1.829 m)  Wt 275 lb (124.739 kg)  BMI 37.29 kg/m2  SpO2 98%   Physical Exam  Constitutional: He is oriented to person, place, and time. Vital signs are normal. He appears well-developed and well-nourished.  Non-toxic appearance. He does not appear ill. No distress.  HENT:  Head: Normocephalic and atraumatic.  Nose: Nose normal.  Mouth/Throat: Oropharynx is clear and moist. No oropharyngeal exudate.  Eyes: Conjunctivae and EOM are normal. Pupils are equal, round, and reactive to light. No scleral icterus.  Neck: Normal range of motion. Neck supple. No tracheal deviation, no edema, no erythema and normal range of motion present. No thyroid mass and no thyromegaly present.  Cardiovascular: Normal rate, regular rhythm, S1 normal, S2 normal, normal heart sounds, intact distal pulses and normal pulses.  Exam reveals no gallop and no friction rub.   No murmur heard. Pulses:      Radial pulses are 2+ on the right side, and 2+ on the left side.       Dorsalis pedis pulses are 2+ on the right side, and 2+ on the left side.  Pulmonary/Chest: Effort normal and breath sounds normal. No respiratory distress. He has no wheezes. He has no rhonchi. He has no rales.  Abdominal: Soft. Normal appearance and bowel sounds are normal. He exhibits no distension, no ascites  and no mass. There is no hepatosplenomegaly. There is no tenderness. There is no rebound, no guarding and no CVA tenderness.  Musculoskeletal: Normal range of motion. He exhibits tenderness. He exhibits no edema.  Left lumbar paraspinal tenderness.   Lymphadenopathy:    He has no cervical adenopathy.  Neurological: He is alert and oriented to person, place, and time. He has normal strength. No cranial nerve deficit or sensory deficit.  Skin: Skin is warm, dry and intact. No petechiae and no rash noted. He is not diaphoretic. No erythema. No pallor.  Psychiatric: He has a normal mood and  affect. His behavior is normal. Judgment normal.  Nursing note and vitals reviewed.   ED Course  Procedures (including critical care time) DIAGNOSTIC STUDIES: Oxygen Saturation is 98% on RA, normal by my interpretation.    COORDINATION OF CARE: 3:59 AM-Discussed treatment plan with pt at bedside and pt agreed to plan.     Labs Review Labs Reviewed - No data to display  Imaging Review No results found.   EKG Interpretation None      MDM   Final diagnoses:  None   patient sent to the emergency department for back pain after laying on it poorly on the mattress. Also did some heavy lifting to reenter the back. He denies any trauma. He has no restless leg symptoms for back pain. He was given Motrin and Norco when she department and will be discharged with a Motrin prescription. He was advised on weight loss, rest, and ice packs during the interval. Primary care follow-up was otherwise within 3 days. He was suggested to return to emergency department this persists or worsens. His health is within his normal limits adhesive for discharge.    I personally performed the services described in this documentation, which was scribed in my presence. The recorded information has been reviewed and is accurate.      Tomasita CrumbleAdeleke Kentaro Alewine, MD 11/19/14 [phone]

## 2016-07-31 ENCOUNTER — Emergency Department (HOSPITAL_COMMUNITY)
Admission: EM | Admit: 2016-07-31 | Discharge: 2016-07-31 | Disposition: A | Discharge status: Home / Self Care | Payer: Self-pay | Attending: Emergency Medicine | Admitting: Emergency Medicine

## 2016-07-31 ENCOUNTER — Encounter (HOSPITAL_COMMUNITY): Payer: Self-pay | Admitting: Emergency Medicine

## 2016-07-31 DIAGNOSIS — H029 Unspecified disorder of eyelid: Secondary | ICD-10-CM | POA: Insufficient documentation

## 2016-07-31 DIAGNOSIS — T7840XA Allergy, unspecified, initial encounter: Secondary | ICD-10-CM

## 2016-07-31 DIAGNOSIS — T781XXA Other adverse food reactions, not elsewhere classified, initial encounter: Secondary | ICD-10-CM | POA: Insufficient documentation

## 2016-07-31 MED ORDER — METHYLPREDNISOLONE SODIUM SUCC 125 MG IJ SOLR
125.0000 mg | Freq: Once | INTRAMUSCULAR | Status: AC
  Administered 2016-07-31: 125 mg via INTRAMUSCULAR
  Filled 2016-07-31: qty 2

## 2016-07-31 MED ORDER — FAMOTIDINE 20 MG PO TABS
20.0000 mg | ORAL_TABLET | Freq: Once | ORAL | Status: AC
Start: 2016-07-31 — End: 2016-07-31
  Administered 2016-07-31: 20 mg via ORAL
  Filled 2016-07-31: qty 1

## 2016-07-31 MED ORDER — PREDNISONE 20 MG PO TABS: ORAL_TABLET | ORAL | 0 refills | Status: DC

## 2016-07-31 MED ORDER — DIPHENHYDRAMINE HCL 25 MG PO CAPS
25.0000 mg | ORAL_CAPSULE | Freq: Once | ORAL | Status: AC
  Administered 2016-07-31: 25 mg via ORAL
  Filled 2016-07-31: qty 1

## 2016-07-31 MED ORDER — DIPHENHYDRAMINE HCL 25 MG PO TABS: 25.0000 mg | ORAL_TABLET | Freq: Four times a day (QID) | ORAL | 0 refills | Status: DC | PRN

## 2016-07-31 NOTE — ED Notes (Signed)
Pt ate a banana approx 1345 and immediately started having periorbital swelling, nasal congestion, "tingling" to face and body. No respiratory distress. States he had a similar episode 1 month ago.

## 2016-07-31 NOTE — ED Provider Notes (Signed)
[facility]-EMERGENCY DEPT Provider Note    By signing my name below, I, Earmon PhoenixJennifer Waddell, attest that this documentation has been prepared under the direction and in the presence of Sharilyn SitesLisa Sanders, PA-C. Electronically Signed: Earmon PhoenixJennifer Waddell, ED Scribe. 07/31/16. 3:01 PM.   History   Chief Complaint Chief Complaint  Patient presents with  . Allergic Reaction    The history is provided by the patient and medical records. No language interpreter was used.    HPI Comments:  Keith Foster is an obese 25 y.o. male who presents to the Emergency Department complaining of an allergic reaction to a banana he ate about one hour ago. He reports similar symptoms when he ate a banana about one month ago, however he reports he has eaten bananas in the past without issue. He reports nasal congestion, rhinorrhea and bilateral eyelid swelling. He reports an associated feeling of his skin crawling. He has not taken anything for treatment. He denies modifying factors. He denies any other food allergies but reports environmental allergies. He denies SOB, tongue or lip swelling, difficulty swallowing, rash, abdominal pain, nausea, vomiting.   Patient has no other known food or environmental allergies.   History reviewed. No pertinent past medical history.  There are no active problems to display for this patient.   Past Surgical History:  Procedure Laterality Date  . ANKLE SURGERY         Home Medications    Prior to Admission medications   Medication Sig Start Date End Date Taking? Authorizing Provider  ibuprofen (ADVIL,MOTRIN) 800 MG tablet Take 1 tablet (800 mg total) by mouth 3 (three) times daily. 11/19/14   Tomasita CrumbleAdeleke Oni, MD  methocarbamol (ROBAXIN) 500 MG tablet Take 1 tablet (500 mg total) by mouth 2 (two) times daily. 11/27/13   Fayrene HelperBowie Tran, PA-C    Family History History reviewed. No pertinent family history.  Social History Social History  Substance Use Topics  . Smoking status: Never  Smoker  . Smokeless tobacco: Never Used  . Alcohol use No     Allergies   Banana   Review of Systems Review of Systems  HENT: Positive for congestion and rhinorrhea. Negative for trouble swallowing.   Eyes: Positive for discharge. Negative for visual disturbance.       Bilateral eyelid swelling.  Respiratory: Negative for shortness of breath.   Gastrointestinal: Negative for abdominal pain, nausea and vomiting.  Skin: Negative for rash.     Physical Exam Updated Vital Signs BP 147/95 (BP Location: Right Arm)   Pulse 95   Temp 98.6 F (37 C) (Oral)   Resp 12   SpO2 99%   Physical Exam  Constitutional: He is oriented to person, place, and time. He appears well-developed and well-nourished.  HENT:  Head: Normocephalic and atraumatic.  Mouth/Throat: Oropharynx is clear and moist.  No lip or tongue swelling. Airway clear. Handling secretions well. Normal phonation, no stridor; no neck swelling + nasal congestion  Eyes: Conjunctivae and EOM are normal. Pupils are equal, round, and reactive to light.  Swelling around bilateral eyes, worse on inferior aspects. No conjunctival injection or hemorrhage; EOMs fully intact; normal vision  Neck: Normal range of motion.  Cardiovascular: Normal rate, regular rhythm and normal heart sounds.   Pulmonary/Chest: Effort normal and breath sounds normal. No stridor. No respiratory distress. He has no wheezes. He has no rales.  Abdominal: Soft. Bowel sounds are normal.  Musculoskeletal: Normal range of motion.  Neurological: He is alert and oriented to person, place, and  time.  Skin: Skin is warm and dry.  Psychiatric: He has a normal mood and affect.  Nursing note and vitals reviewed.    ED Treatments / Results  DIAGNOSTIC STUDIES: Oxygen Saturation is 99% on RA, normal by my interpretation.   COORDINATION OF CARE: 2:59 PM- Will order Pepcid, Benadryl and SoluMedrol. Pt verbalizes understanding and agrees to plan.  Medications    methylPREDNISolone sodium succinate (SOLU-MEDROL) 125 mg/2 mL injection 125 mg (125 mg Intramuscular Given 07/31/16 1506)  diphenhydrAMINE (BENADRYL) capsule 25 mg (25 mg Oral Given 07/31/16 1504)  famotidine (PEPCID) tablet 20 mg (20 mg Oral Given 07/31/16 1505)    Labs (all labs ordered are listed, but only abnormal results are displayed) Labs Reviewed - No data to display  EKG  EKG Interpretation None       Radiology No results found.  Procedures Procedures (including critical care time)  Medications Ordered in ED Medications  methylPREDNISolone sodium succinate (SOLU-MEDROL) 125 mg/2 mL injection 125 mg (125 mg Intramuscular Given 07/31/16 1506)  diphenhydrAMINE (BENADRYL) capsule 25 mg (25 mg Oral Given 07/31/16 1504)  famotidine (PEPCID) tablet 20 mg (20 mg Oral Given 07/31/16 1505)     Initial Impression / Assessment and Plan / ED Course  I have reviewed the triage vital signs and the nursing notes.  Pertinent labs & imaging results that were available during my care of the patient were reviewed by me and considered in my medical decision making (see chart for details).  Clinical Course    25 y.o. M here with apparent allergic reaction to banana.  Reports similar symptoms about a month ago, also while eating banana.  He has no documented allergies to bananas however.  He is afebrile, non-toxic.  Today he does have some eyelid swelling, worse on the inferior aspects of his eyes.  No has no lip, tongue, or neck swelling.  He is handling secretions well, normal phonation without stridor. Denies chest pain or SOB.  Patient treated here with solu-medrol, benadryl, and pepcid.  Will monitor.  3:52 PM After treatment, swelling around patient's eyes has improved somewhat. He is no longer itching or has sensation of skin crawling. He remains without any airway compromise, lip, or tongue swelling. His vitals remained stable. Feel he is appropriate for discharge home with continued  supportive care. Will send home with prednisone taper, continue benadryl.  Recommended to avoid bananas.  Follow-up with PCP.  Discussed plan with patient, he acknowledged understanding and agreed with plan of care.  Return precautions given for new or worsening symptoms.  I personally performed the services described in this documentation, which was scribed in my presence. The recorded information has been reviewed and is accurate.  Final Clinical Impressions(s) / ED Diagnoses   Final diagnoses:  Allergic reaction, initial encounter    New Prescriptions New Prescriptions   DIPHENHYDRAMINE (BENADRYL) 25 MG TABLET    Take 1 tablet (25 mg total) by mouth every 6 (six) hours as needed for itching (Rash).   PREDNISONE (DELTASONE) 20 MG TABLET    Take 40 mg by mouth daily for 3 days, then 20mg  by mouth daily for 3 days, then 10mg  daily for 3 days     Garlon HatchetLisa M Sanders, PA-C 07/31/16 1559    Lyndal Pulleyaniel Knott, MD 07/31/16 [id number]

## 2016-07-31 NOTE — Discharge Instructions (Signed)
Continue benadryl every 6 hours to help with swelling.  Start prednisone tomorrow, you have already had today's dose. Swelling should improve gradually.  Recommend to avoid bananas. Follow-up with your primary care doctor. Return to the ED for new or worsening symptoms-- sensation of tongue/throat swelling, shortness of breath, etc.

## 2016-07-31 NOTE — ED Triage Notes (Signed)
Pt states he ate a banana today and started having nasal congestion, eye lid swelling, feeling like his skin is crawling. Pt is not SOB. Airway intact. Pt states this happened one other time when he ate a banana.

## 2016-07-31 NOTE — ED Notes (Signed)
Patient Alert and oriented X4. Stable and ambulatory. Patient verbalized understanding of the discharge instructions.  Patient belongings were taken by the patient.  

## 2017-04-28 ENCOUNTER — Encounter (HOSPITAL_COMMUNITY): Payer: Self-pay | Admitting: Emergency Medicine

## 2017-04-28 ENCOUNTER — Emergency Department (HOSPITAL_COMMUNITY)
Admission: EM | Admit: 2017-04-28 | Discharge: 2017-04-28 | Disposition: A | Discharge status: Home / Self Care | Payer: 59 | Attending: Emergency Medicine | Admitting: Emergency Medicine

## 2017-04-28 DIAGNOSIS — Y998 Other external cause status: Secondary | ICD-10-CM | POA: Diagnosis not present

## 2017-04-28 DIAGNOSIS — Z79899 Other long term (current) drug therapy: Secondary | ICD-10-CM | POA: Insufficient documentation

## 2017-04-28 DIAGNOSIS — T22112A Burn of first degree of left forearm, initial encounter: Secondary | ICD-10-CM | POA: Insufficient documentation

## 2017-04-28 DIAGNOSIS — T22111A Burn of first degree of right forearm, initial encounter: Secondary | ICD-10-CM | POA: Diagnosis not present

## 2017-04-28 DIAGNOSIS — T2010XA Burn of first degree of head, face, and neck, unspecified site, initial encounter: Secondary | ICD-10-CM | POA: Insufficient documentation

## 2017-04-28 DIAGNOSIS — X088XXA Exposure to other specified smoke, fire and flames, initial encounter: Secondary | ICD-10-CM | POA: Insufficient documentation

## 2017-04-28 DIAGNOSIS — Y92009 Unspecified place in unspecified non-institutional (private) residence as the place of occurrence of the external cause: Secondary | ICD-10-CM | POA: Diagnosis not present

## 2017-04-28 DIAGNOSIS — Y9389 Activity, other specified: Secondary | ICD-10-CM | POA: Insufficient documentation

## 2017-04-28 DIAGNOSIS — T300 Burn of unspecified body region, unspecified degree: Secondary | ICD-10-CM

## 2017-04-28 DIAGNOSIS — T2000XA Burn of unspecified degree of head, face, and neck, unspecified site, initial encounter: Secondary | ICD-10-CM | POA: Diagnosis present

## 2017-04-28 MED ORDER — HYDROCODONE-ACETAMINOPHEN 5-325 MG PO TABS: 1.0000 | ORAL_TABLET | Freq: Four times a day (QID) | ORAL | 0 refills | Status: AC | PRN

## 2017-04-28 MED ORDER — BACITRACIN ZINC 500 UNIT/GM EX OINT
TOPICAL_OINTMENT | Freq: Two times a day (BID) | CUTANEOUS | Status: DC
  Administered 2017-04-28: 1 via TOPICAL

## 2017-04-28 MED ORDER — LACTATED RINGERS IV BOLUS (SEPSIS)
1000.0000 mL | Freq: Once | INTRAVENOUS | Status: AC
  Administered 2017-04-28: 1000 mL via INTRAVENOUS
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
## 2019-12-03 ENCOUNTER — Encounter (HOSPITAL_COMMUNITY): Payer: Self-pay

## 2019-12-03 ENCOUNTER — Ambulatory Visit (HOSPITAL_COMMUNITY)
Admission: EM | Admit: 2019-12-03 | Discharge: 2019-12-03 | Disposition: A | Discharge status: Home / Self Care | Payer: 59 | Attending: Family Medicine | Admitting: Family Medicine

## 2019-12-03 ENCOUNTER — Other Ambulatory Visit: Payer: Self-pay

## 2019-12-03 DIAGNOSIS — Z711 Person with feared health complaint in whom no diagnosis is made: Secondary | ICD-10-CM | POA: Diagnosis not present

## 2019-12-03 NOTE — Discharge Instructions (Addendum)
We are testing you for STDs  We will call you with any positive results.  You can check my chart for results

## 2019-12-03 NOTE — ED Triage Notes (Signed)
Pt states his partner informed him that she had been diagnosed with gonorrhea. Pt denies penile discharge, penile rash, abdom pain, n/v, or fever/chills.

## 2019-12-04 LAB — CYTOLOGY, (ORAL, ANAL, URETHRAL) ANCILLARY ONLY
Chlamydia: NEGATIVE
Comment: NEGATIVE
Comment: NORMAL
Neisseria Gonorrhea: NEGATIVE

## 2019-12-05 NOTE — ED Provider Notes (Signed)
[facility]    CSN: [id number] Arrival date & time: 12/03/19  2355      History   Chief Complaint Chief Complaint  Patient presents with  . Exposure to STD    HPI Keith Foster is a 29 y.o. male.   Patient is a 29 year old male that presents today with concern for STD.  Reporting that his partner informed him that she has been diagnosed with gonorrhea.  Patient currently denies any penile discharge, penile rash, abdominal pain, testicle pain or swelling.  ROS per HPI      History reviewed. No pertinent past medical history.  There are no problems to display for this patient.   Past Surgical History:  Procedure Laterality Date  . ANKLE SURGERY         Home Medications    Prior to Admission medications   Medication Sig Start Date End Date Taking? Authorizing Provider  ibuprofen (ADVIL,MOTRIN) 800 MG tablet Take 1 tablet (800 mg total) by mouth 3 (three) times daily. 11/19/14   Everlene Balls, MD  diphenhydrAMINE (BENADRYL) 25 MG tablet Take 1 tablet (25 mg total) by mouth every 6 (six) hours as needed for itching (Rash). 07/31/16 12/03/19  Larene Pickett, PA-C    Family History Family History  Problem Relation Age of Onset  . Healthy Mother   . Healthy Father     Social History Social History   Tobacco Use  . Smoking status: Never Smoker  . Smokeless tobacco: Never Used  Substance Use Topics  . Alcohol use: No  . Drug use: No     Allergies   Banana   Review of Systems Review of Systems   Physical Exam Triage Vital Signs ED Triage Vitals  Enc Vitals Group     BP 12/03/19 0827 132/87     Pulse Rate 12/03/19 0827 (!) 101     Resp 12/03/19 0827 18     Temp 12/03/19 0827 99.4 F (37.4 C)     Temp Source 12/03/19 0827 Oral     SpO2 12/03/19 0827 100 %     Weight --      Height --      Head Circumference --      Peak Flow --      Pain Score 12/03/19 0829 0     Pain Loc --      Pain Edu? --      Excl. in [place]? --    No data  found.  Updated Vital Signs BP 132/87 (BP Location: Left Arm)   Pulse (!) 101   Temp 99.4 F (37.4 C) (Oral)   Resp 18   SpO2 100%   Visual Acuity Right Eye Distance:   Left Eye Distance:   Bilateral Distance:    Right Eye Near:   Left Eye Near:    Bilateral Near:     Physical Exam Vitals and nursing note reviewed.  Constitutional:      Appearance: Normal appearance.  HENT:     Head: Normocephalic and atraumatic.     Nose: Nose normal.  Eyes:     Conjunctiva/sclera: Conjunctivae normal.  Pulmonary:     Effort: Pulmonary effort is normal.  Musculoskeletal:        General: Normal range of motion.     Cervical back: Normal range of motion.  Skin:    General: Skin is warm and dry.  Neurological:     Mental Status: He is alert.  Psychiatric:  Mood and Affect: Mood normal.      UC Treatments / Results  Labs (all labs ordered are listed, but only abnormal results are displayed) Labs Reviewed  CYTOLOGY, (ORAL, ANAL, URETHRAL) ANCILLARY ONLY    EKG   Radiology No results found.  Procedures Procedures (including critical care time)  Medications Ordered in UC Medications - No data to display  Initial Impression / Assessment and Plan / UC Course  I have reviewed the triage vital signs and the nursing notes.  Pertinent labs & imaging results that were available during my care of the patient were reviewed by me and considered in my medical decision making (see chart for details).     Concern for STD-treating prophylactically for gonorrhea based on exposure. Swab sent for testing Final Clinical Impressions(s) / UC Diagnoses   Final diagnoses:  Concern about STD in male without diagnosis     Discharge Instructions     We are testing you for STDs  We will call you with any positive results.  You can check my chart for results      ED Prescriptions    None     PDMP not reviewed this encounter.   Dahlia Byes A, NP 12/05/19 1015

## 2019-12-18 ENCOUNTER — Emergency Department (HOSPITAL_COMMUNITY): Payer: 59

## 2019-12-18 ENCOUNTER — Emergency Department (HOSPITAL_COMMUNITY)
Admission: EM | Admit: 2019-12-18 | Discharge: 2019-12-18 | Disposition: A | Discharge status: Home / Self Care | Payer: 59 | Attending: Emergency Medicine | Admitting: Emergency Medicine

## 2019-12-18 ENCOUNTER — Other Ambulatory Visit: Payer: Self-pay

## 2019-12-18 DIAGNOSIS — R000 Tachycardia, unspecified: Secondary | ICD-10-CM | POA: Diagnosis not present

## 2019-12-18 DIAGNOSIS — R0602 Shortness of breath: Secondary | ICD-10-CM | POA: Insufficient documentation

## 2019-12-18 DIAGNOSIS — R634 Abnormal weight loss: Secondary | ICD-10-CM | POA: Insufficient documentation

## 2019-12-18 DIAGNOSIS — R072 Precordial pain: Secondary | ICD-10-CM | POA: Insufficient documentation

## 2019-12-18 DIAGNOSIS — R202 Paresthesia of skin: Secondary | ICD-10-CM | POA: Insufficient documentation

## 2019-12-18 DIAGNOSIS — F419 Anxiety disorder, unspecified: Secondary | ICD-10-CM | POA: Insufficient documentation

## 2019-12-18 DIAGNOSIS — R0789 Other chest pain: Secondary | ICD-10-CM | POA: Diagnosis present

## 2019-12-18 LAB — BASIC METABOLIC PANEL
Anion gap: 13 (ref 5–15)
BUN: 6 mg/dL (ref 6–20)
CO2: 22 mmol/L (ref 22–32)
Calcium: 9.5 mg/dL (ref 8.9–10.3)
Chloride: 102 mmol/L (ref 98–111)
Creatinine, Ser: 1.15 mg/dL (ref 0.61–1.24)
GFR calc Af Amer: >60 mL/min (ref >60)
GFR calc non Af Amer: >60 mL/min (ref >60)
Glucose, Bld: 90 mg/dL (ref 70–99)
Potassium: 3.9 mmol/L (ref 3.5–5.1)
Sodium: 137 mmol/L (ref 135–145)

## 2019-12-18 LAB — TROPONIN I (HIGH SENSITIVITY)
Troponin I (High Sensitivity): 4 ng/L (ref <18)
Troponin I (High Sensitivity): <2 ng/L (ref <18)

## 2019-12-18 LAB — CBC
HCT: 51.7 % (ref 39.0–52.0)
Hemoglobin: 16.3 g/dL (ref 13.0–17.0)
MCH: 22.3 pg — ABNORMAL LOW (ref 26.0–34.0)
MCHC: 31.5 g/dL (ref 30.0–36.0)
MCV: 70.8 fL — ABNORMAL LOW (ref 80.0–100.0)
Platelets: 179 10*3/uL (ref 150–400)
RBC: 7.3 MIL/uL — ABNORMAL HIGH (ref 4.22–5.81)
RDW: 17.1 % — ABNORMAL HIGH (ref 11.5–15.5)
WBC: 6.3 10*3/uL (ref 4.0–10.5)
nRBC: 0 % (ref 0.0–0.2)

## 2019-12-18 LAB — D-DIMER, QUANTITATIVE: D-Dimer, Quant: <0.27 ug/mL-FEU (ref 0.00–0.50)

## 2019-12-18 MED ORDER — LORAZEPAM 1 MG PO TABS
1.0000 mg | ORAL_TABLET | Freq: Once | ORAL | Status: AC
  Administered 2019-12-18: 1 mg via ORAL
  Filled 2019-12-18: qty 1

## 2019-12-18 MED ORDER — SODIUM CHLORIDE 0.9% FLUSH: 3.0000 mL | Freq: Once | INTRAVENOUS | Status: DC

## 2019-12-18 NOTE — Discharge Instructions (Signed)
Please read and follow all provided instructions.  Your diagnoses today include:  1. Precordial pain     Tests performed today include:  An EKG of your heart  A chest x-ray  Cardiac enzymes - a blood test for heart muscle damage, no sign of stress on the heart  Blood counts and electrolytes  D-dimer - screening test for blood clot was normal  Vital signs. See below for your results today.   Medications prescribed:   None  Take any prescribed medications only as directed.  Follow-up instructions: Please follow-up with your primary care provider as soon as you can for further evaluation of your symptoms.   Return instructions:  SEEK IMMEDIATE MEDICAL ATTENTION IF:  You have severe chest pain, especially if the pain is crushing or pressure-like and spreads to the arms, back, neck, or jaw, or if you have sweating, nausea (feeling sick to your stomach), or shortness of breath. THIS IS AN EMERGENCY. Don't wait to see if the pain will go away. Get medical help at once. Call 911 or 0 (operator). DO NOT drive yourself to the hospital.   Your chest pain gets worse and does not go away with rest.   You have an attack of chest pain lasting longer than usual, despite rest and treatment with the medications your caregiver has prescribed.   You wake from sleep with chest pain or shortness of breath.  You feel dizzy or faint.  You have chest pain not typical of your usual pain for which you originally saw your caregiver.   You have any other emergent concerns regarding your health.  Additional Information: Chest pain comes from many different causes. Your caregiver has diagnosed you as having chest pain that is not specific for one problem, but does not require admission.  You are at low risk for an acute heart condition or other serious illness.   Your vital signs today were: BP (!) 141/113   Pulse 73   Temp 98.2 F (36.8 C) (Oral)   Resp 17   SpO2 99%  If your blood pressure  (BP) was elevated above 135/85 this visit, please have this repeated by your doctor within one month. --------------

## 2019-12-18 NOTE — ED Triage Notes (Signed)
Pt c/o CP and SOB.  Denies cardiac hx.

## 2019-12-18 NOTE — ED Notes (Signed)
Pt up to Sort desk reporting  He has CP .

## 2019-12-18 NOTE — ED Notes (Signed)
Unable to locate in waiting room

## 2019-12-18 NOTE — ED Provider Notes (Signed)
[facility] EMERGENCY DEPARTMENT Provider Note   CSN: [id number] Arrival date & time: 12/18/19  6568     History Chief Complaint  Patient presents with  . Chest Pain    Keith Foster is a 29 y.o. male.  Patient with no significant past medical history presents to the emergency department today with complaint of chest pain and shortness of breath.  Patient states that he felt in his usual state of health yesterday.  He awoke from sleep at approximately 5:30 AM today with complaint of a generalized burning chest pain across his chest with numb, pins-and-needles sensation into his bilateral arms.  No weakness associated with this.  Patient was concerned that he was having a heart attack so he came directly to the hospital.  No associated nausea or vomiting.  No exertional symptoms today or recently.  Symptoms improved but then returned again while in the waiting room.  He denies any fevers, cough.  Pain is not changed with food or position.  He denies heavy NSAID or alcohol use.  Patient does not use illicit drugs.  He denies a history of hypertension, high cholesterol, diabetes, tobacco use, family history of heart disease. Patient denies risk factors for pulmonary embolism including: unilateral leg swelling, history of DVT/PE/other blood clots, use of exogenous hormones, recent immobilizations, recent surgerymalignancy, hemoptysis.  Early this month patient did travel to [facility] on a direct flight from [place] and then back again.  He denies any leg cramping or pain and is walking without any difficulty.  Patient does report being under an immense amount of stress recently.  He reports an approximately 40 pound weight loss over the past several months because he has not been eating well.  No lower extremity or abdominal symptoms.        No past medical history on file.  There are no problems to display for this patient.   Past Surgical History:  Procedure  Laterality Date  . ANKLE SURGERY         Family History  Problem Relation Age of Onset  . Healthy Mother   . Healthy Father     Social History   Tobacco Use  . Smoking status: Never Smoker  . Smokeless tobacco: Never Used  Substance Use Topics  . Alcohol use: No  . Drug use: No    Home Medications Prior to Admission medications   Medication Sig Start Date End Date Taking? Authorizing Provider  ibuprofen (ADVIL,MOTRIN) 800 MG tablet Take 1 tablet (800 mg total) by mouth 3 (three) times daily. 11/19/14   Tomasita Crumble, MD  diphenhydrAMINE (BENADRYL) 25 MG tablet Take 1 tablet (25 mg total) by mouth every 6 (six) hours as needed for itching (Rash). 07/31/16 12/03/19  Garlon Hatchet, PA-C    Allergies    Banana  Review of Systems   Review of Systems  Constitutional: Positive for appetite change and unexpected weight change. Negative for diaphoresis and fever.  Eyes: Negative for redness.  Respiratory: Positive for shortness of breath. Negative for cough.   Cardiovascular: Positive for chest pain. Negative for palpitations and leg swelling.  Gastrointestinal: Negative for abdominal pain, nausea and vomiting.  Genitourinary: Negative for dysuria.  Musculoskeletal: Negative for back pain and neck pain.  Skin: Negative for rash.  Neurological: Positive for numbness (paresthesias). Negative for syncope and light-headedness.  Psychiatric/Behavioral: The patient is nervous/anxious.     Physical Exam Updated Vital Signs BP (!) 141/113   Pulse (!) 103  Temp 98.2 F (36.8 C) (Oral)   Resp 20   SpO2 100%   Physical Exam Vitals and nursing note reviewed.  Constitutional:      Appearance: He is well-developed. He is not diaphoretic.  HENT:     Head: Normocephalic and atraumatic.     Mouth/Throat:     Mouth: Mucous membranes are not dry.  Eyes:     Conjunctiva/sclera: Conjunctivae normal.  Neck:     Vascular: Normal carotid pulses. No carotid bruit or JVD.     Trachea:  Trachea normal. No tracheal deviation.  Cardiovascular:     Rate and Rhythm: Regular rhythm. Tachycardia present.     Pulses: No decreased pulses.          Radial pulses are 2+ on the right side and 2+ on the left side.       Dorsalis pedis pulses are 2+ on the right side and 2+ on the left side.     Heart sounds: Normal heart sounds, S1 normal and S2 normal. Heart sounds not distant. No murmur.     Comments: Mild tachycardia at times during exam. No murmur. Normal symmetric pulses.  Pulmonary:     Effort: Pulmonary effort is normal. No respiratory distress.     Breath sounds: Normal breath sounds. No wheezing, rhonchi or rales.  Chest:     Chest wall: No tenderness.  Abdominal:     General: Bowel sounds are normal.     Palpations: Abdomen is soft.     Tenderness: There is no abdominal tenderness. There is no guarding or rebound.  Musculoskeletal:     Cervical back: Normal range of motion and neck supple. No muscular tenderness.     Right lower leg: No tenderness. No edema.     Left lower leg: No tenderness. No edema.     Comments: No gross swelling, tenderness, clinical signs and symptoms of DVT.  Skin:    General: Skin is warm and dry.     Coloration: Skin is not pale.  Neurological:     General: No focal deficit present.     Mental Status: He is alert.     Sensory: No sensory deficit.     Motor: No weakness.     Comments: No weakness noted on exam of upper and lower extremities. No distal sensation deficit at time of exam despite reported paresthesias.   Psychiatric:        Mood and Affect: Mood is anxious.     ED Results / Procedures / Treatments   Labs (all labs ordered are listed, but only abnormal results are displayed) Labs Reviewed  CBC - Abnormal; Notable for the following components:      Result Value   RBC 7.30 (*)    MCV 70.8 (*)    MCH 22.3 (*)    RDW 17.1 (*)    All other components within normal limits  BASIC METABOLIC PANEL  D-DIMER, QUANTITATIVE (NOT  AT [facility])  TROPONIN I (HIGH SENSITIVITY)  TROPONIN I (HIGH SENSITIVITY)    EKG EKG Interpretation  Date/Time:  Friday December 18 2019 11:27:13 EDT Ventricular Rate:  100 PR Interval:  152 QRS Duration: 80 QT Interval:  325 QTC Calculation: 420 R Axis:   41 Text Interpretation: Sinus tachycardia ST elevation suggests acute pericarditis No significant change since last tracing Confirmed by Frederick Peers [phone]) on 12/18/2019 11:42:54 AM   Radiology DG Chest 2 View  Result Date: 12/18/2019 CLINICAL DATA:  Shortness of breath and chest  pain this morning. EXAM: CHEST - 2 VIEW COMPARISON:  05/02/2011 FINDINGS: The cardiac silhouette, mediastinal and hilar contours are normal. The lungs are clear. No pleural effusion. No pulmonary lesions. The bony thorax is intact. IMPRESSION: No acute cardiopulmonary findings. Electronically Signed   By: Marijo Sanes M.D.   On: 12/18/2019 07:21    Procedures Procedures (including critical care time)  Medications Ordered in ED Medications  sodium chloride flush (NS) 0.9 % injection 3 mL (has no administration in time range)  LORazepam (ATIVAN) tablet 1 mg (1 mg Oral Given 12/18/19 1231)    ED Course  I have reviewed the triage vital signs and the nursing notes.  Pertinent labs & imaging results that were available during my care of the patient were reviewed by me and considered in my medical decision making (see chart for details).  Patient seen and examined. Work-up reviewed.  Ativan ordered for anxiety.  Patient looks well, but anxious.  First troponin negative, however drawn shortly after symptom onset.  Initial EKG reviewed, poor quality.  This was repeated showing some less than 1 mm ST segment elevations in the precordial leads with T wave inversion in lead III.  No old for comparison. EKG stable during ED stay.  No concern for PE but given recent travel and mild tachycardia, will screen for PE with D-dimer.  Discussed need for CT imaging if  positive.  Vital signs reviewed and are as follows: BP (!) 141/113   Pulse (!) 103   Temp 98.2 F (36.8 C) (Oral)   Resp 20   SpO2 100%   1:28 PM patient resting comfortably.  Appears more relaxed.  We discussed lab results which she has pulled up on his phone.  At this point, discussed no indications for further testing or work-up.  He is able to relax over the weekend.  Patient was counseled to return with severe chest pain, especially if the pain is crushing or pressure-like and spreads to the arms, back, neck, or jaw, or if they have sweating, nausea, or shortness of breath with the pain. They were encouraged to call 911 with these symptoms.   They were also told to return if their chest pain gets worse and does not go away with rest, they have an attack of chest pain lasting longer than usual despite rest and treatment with the medications their caregiver has prescribed, if they wake from sleep with chest pain or shortness of breath, if they feel dizzy or faint, if they have chest pain not typical of their usual pain, or if they have any other emergent concerns regarding their health.  The patient verbalized understanding and agreed.      MDM Rules/Calculators/A&P HEAR Score: 2                    Patient with chest pain with atypical features. Feel patient is low risk for ACS given history (poor story for ACS/MI), negative troponin(s), unchanged EKG during ED stay although some non-specific ST segment changes.   No old for comparison. HEART score = 2. D-dimer was also checked given atypical symptoms, shortness of breath, and recent travel.  This was normal.  Patient denies neck pain, back pain, stroke symptoms, arterial changes in arms or legs that would suggest dissection or aneurysm and I do not feel strongly that patient needs advanced imaging to evaluate for these at this time.  Chest x-ray is clear.  We discussed signs and symptoms which should cause him to  return.  He does have a  history which would suggest an element of anxiety.  Strongly encouraged PCP follow-up, referrals given.  Discussed strict return instructions as above.     Final Clinical Impression(s) / ED Diagnoses Final diagnoses:  Precordial pain    Rx / DC Orders ED Discharge Orders    None       Renne Crigler, PA-C 12/18/19 1334    Little, Ambrose Finland, MD 12/19/19 [phone]

## 2019-12-18 NOTE — ED Notes (Signed)
Pt back in lobby.

## 2019-12-19 ENCOUNTER — Ambulatory Visit (HOSPITAL_COMMUNITY)
Admission: EM | Admit: 2019-12-19 | Discharge: 2019-12-19 | Disposition: A | Discharge status: Home / Self Care | Payer: 59 | Attending: Family Medicine | Admitting: Family Medicine

## 2019-12-19 ENCOUNTER — Encounter (HOSPITAL_COMMUNITY): Payer: Self-pay

## 2019-12-19 DIAGNOSIS — R079 Chest pain, unspecified: Secondary | ICD-10-CM

## 2019-12-19 DIAGNOSIS — R000 Tachycardia, unspecified: Secondary | ICD-10-CM

## 2019-12-19 MED ORDER — METOPROLOL TARTRATE 50 MG PO TABS: ORAL_TABLET | ORAL | 0 refills | Status: AC

## 2019-12-19 NOTE — ED Triage Notes (Signed)
Pt c/o 10/10 stabbing left sided chest pain that radiates down both arms since yesterday. Pt states he's been tachycardic. Pt states his chest has been twitching yesterday. Pt c/o SOB with the chest pain. Pt has non labored breathing. Pt was seen at ED yesterday and was told he was fine and all tests looked good.

## 2019-12-19 NOTE — ED Provider Notes (Signed)
[facility]   [id number] 12/19/19 Arrival Time: 1003  ASSESSMENT & PLAN:  1. Chest pain, unspecified type   2. Tachycardia     Declines TSH today.   ECG: Performed today and interpreted by me: sinus tachycardia.  No indication to re-image chest.  Begin: Meds ordered this encounter  Medications  . metoprolol tartrate (LOPRESSOR) 50 MG tablet    Sig: Take 1/2 to 1 tablet twice daily for your fast heart rate.    Dispense:  30 tablet    Refill:  0    Recommend: Follow-up Information    [facility].   Specialty: Emergency Medicine Why: If symptoms worsen in any way. Contact information: [address] [id number] [place] [phone]       Schedule an appointment as soon as possible for a visit  with [facility].   Specialty: Cardiology Contact information: [address] [place] [address] [phone]          Chest pain precautions given. Reviewed expectations re: course of current medical issues. Questions answered. Outlined signs and symptoms indicating need for more acute intervention. Patient verbalized understanding. After Visit Summary given.   SUBJECTIVE: ED visit dated 12/18/2019 reviewed. Negative troponin x 2, d-dimer, CXR.  History from: patient. Keith Foster is a 29 y.o. male who presents with complaint of chest pain across anterior chest and sometimes into both arms. "Heart feels like it's racing" when symptoms present. Also with feeling SOB at times. Abrupt onset yesterday morning. Felt "burning" across chest with tingling in bilateral arms. Intermittent since. Seen in ED yesterday. Afebrile. No recent illnesses. No new medications. Drug use: denied.  Reports much stress recently. Ambulatory without difficulty. No specific aggravating or alleviating factors reported concerning above symptoms.  Social History    Tobacco Use  Smoking Status Never Smoker  Smokeless Tobacco Never Used   Social History   Substance and Sexual Activity  Alcohol Use No   Increased blood pressure noted today. Reports that he has not been treated for hypertension in the past.  He reports no swelling of ankles, no orthostatic dizziness or lightheadedness, no orthopnea or paroxysmal nocturnal dyspnea, no palpitations and no intermittent claudication symptoms.   OBJECTIVE:  Vitals:   12/19/19 1007 12/19/19 1011  BP: (!) 147/93   Pulse: (!) 122   Resp: 16   Temp: 98.4 F (36.9 C)   TempSrc: Oral   SpO2: 100%   Weight:  105.2 kg  Height:  6' (1.829 m)    Recheck Pulse: 110-130 and regular  General appearance: alert, oriented, no acute distress Eyes: PERRLA; EOMI; conjunctivae normal HENT: normocephalic; atraumatic Neck: supple with FROM Lungs: without labored respirations; speaks full sentences without difficulty; CTAB Heart: tachycardia; regular Extremities: without edema; without calf swelling or tenderness; symmetrical without gross deformities Skin: warm and dry; without rash or lesions Neuro: normal gait Psychological: alert and cooperative; normal mood and affect  Labs Reviewed: Results for orders placed or performed during the hospital encounter of 95/18/84  Basic metabolic panel  Result Value Ref Range   Sodium 137 135 - 145 mmol/L   Potassium 3.9 3.5 - 5.1 mmol/L   Chloride 102 98 - 111 mmol/L   CO2 22 22 - 32 mmol/L   Glucose, Bld 90 70 - 99 mg/dL   BUN 6 6 - 20 mg/dL   Creatinine, Ser 1.15 0.61 - 1.24 mg/dL   Calcium 9.5 8.9 -  10.3 mg/dL   GFR calc non Af Amer >60 >60 mL/min   GFR calc Af Amer >60 >60 mL/min   Anion gap 13 5 - 15  CBC  Result Value Ref Range   WBC 6.3 4.0 - 10.5 K/uL   RBC 7.30 (H) 4.22 - 5.81 MIL/uL   Hemoglobin 16.3 13.0 - 17.0 g/dL   HCT 16.1 09.6 - 04.5 %   MCV 70.8 (L) 80.0 - 100.0 fL   MCH 22.3 (L) 26.0 - 34.0 pg   MCHC 31.5 30.0 - 36.0 g/dL   RDW 40.9  (H) 81.1 - 15.5 %   Platelets 179 150 - 400 K/uL   nRBC 0.0 0.0 - 0.2 %  D-dimer, quantitative (not at [facility])  Result Value Ref Range   D-Dimer, Quant <0.27 0.00 - 0.50 ug/mL-FEU  Troponin I (High Sensitivity)  Result Value Ref Range   Troponin I (High Sensitivity) 4 <18 ng/L  Troponin I (High Sensitivity)  Result Value Ref Range   Troponin I (High Sensitivity) <2 <18 ng/L     Allergies  Allergen Reactions  . Banana     Eye lid swelling, nasal congestion     Social History   Socioeconomic History  . Marital status: Single    Spouse name: Not on file  . Number of children: Not on file  . Years of education: Not on file  . Highest education level: Not on file  Occupational History  . Not on file  Tobacco Use  . Smoking status: Never Smoker  . Smokeless tobacco: Never Used  Substance and Sexual Activity  . Alcohol use: No  . Drug use: No  . Sexual activity: Yes    Birth control/protection: Condom  Other Topics Concern  . Not on file  Social History Narrative  . Not on file   Social Determinants of Health   Financial Resource Strain:   . Difficulty of Paying Living Expenses:   Food Insecurity:   . Worried About Programme researcher, broadcasting/film/video in the Last Year:   . Barista in the Last Year:   Transportation Needs:   . Freight forwarder (Medical):   Marland Kitchen Lack of Transportation (Non-Medical):   Physical Activity:   . Days of Exercise per Week:   . Minutes of Exercise per Session:   Stress:   . Feeling of Stress :   Social Connections:   . Frequency of Communication with Friends and Family:   . Frequency of Social Gatherings with Friends and Family:   . Attends Religious Services:   . Active Member of Clubs or Organizations:   . Attends Banker Meetings:   Marland Kitchen Marital Status:   Intimate Partner Violence:   . Fear of Current or Ex-Partner:   . Emotionally Abused:   Marland Kitchen Physically Abused:   . Sexually Abused:    Family History  Problem Relation  Age of Onset  . Healthy Mother   . Healthy Father    Past Surgical History:  Procedure Laterality Date  . ANKLE SURGERY       Mardella Layman, MD 12/19/19 [phone]

## 2020-01-06 ENCOUNTER — Encounter: Payer: Self-pay | Admitting: Cardiology

## 2020-01-06 ENCOUNTER — Ambulatory Visit: Payer: Self-pay | Admitting: Cardiology

## 2020-01-06 ENCOUNTER — Other Ambulatory Visit: Payer: Self-pay

## 2020-01-06 VITALS — BP 126/86 | HR 79 | Temp 97.4°F | Resp 16 | Ht 72.0 in | Wt 240.1 lb

## 2020-01-06 DIAGNOSIS — R072 Precordial pain: Secondary | ICD-10-CM

## 2020-01-06 DIAGNOSIS — R002 Palpitations: Secondary | ICD-10-CM

## 2020-01-06 NOTE — Progress Notes (Signed)
Date:  01/06/2020   ID:  Hosie Spangle, DOB 07-12-1991, MRN [id number]  PCP:  Patient, No Pcp Per  Cardiologist:  Rex Kras, DO, FACC (established care 01/05/2020)  REASON FOR CONSULT: Chest pain evaluation This is a Chief Complaint  Patient presents with  . Chest Pain  . Hospitalization Follow-up    HPI  Keith Foster is a 29 y.o. male who is a self referral to today's office visit for evaluation of chest pain after recently being seen at ER.    Patient states that he recently went to the hospital for evaluation of chest pain. His initial chest pain was 2 weeks ago started at night while he was at rest.  Located over the anterior chest wall and radiated to bilateral arms.  Intensity 7 out of 10, ache-like sensation, not associated with effort related activities and does not improve with rest.  It was self-limited and intermittent.  The pain to bilateral arms were described as pins-and-needles-like sensation.  Patient states that he never has chest discomfort or anginal symptoms while he is at the gym exercising.   Patient also states that he has been under a lot of stress from financial standpoint and family standpoint.  He was started on beta-blocker therapy at the time of discharge from the ER but states that he does not want to be on medical therapy for long-term.  No family history of premature coronary disease or sudden cardiac death.  No prior history of syncope.  Denies prior history of coronary artery disease, myocardial infarction, congestive heart failure, deep venous thrombosis, pulmonary embolism, stroke, transient ischemic attack.  FUNCTIONAL STATUS: With basketball at least once a week.  Goes to the gym for 1 to 2 hours several times a week.  ALLERGIES: Allergies  Allergen Reactions  . Banana     Eye lid swelling, nasal congestion    MEDICATION LIST PRIOR TO VISIT: Current Meds  Medication Sig  . metoprolol tartrate (LOPRESSOR) 50 MG tablet Take 1/2 to 1  tablet twice daily for your fast heart rate.     PAST MEDICAL HISTORY: History reviewed. No pertinent past medical history.  PAST SURGICAL HISTORY: Past Surgical History:  Procedure Laterality Date  . ANKLE SURGERY      FAMILY HISTORY: The patient family history includes Healthy in his father and mother.  SOCIAL HISTORY:  The patient  reports that he has quit smoking. His smoking use included cigars. He has a 1.25 pack-year smoking history. He has never used smokeless tobacco. He reports that he does not drink alcohol or use drugs.  REVIEW OF SYSTEMS: Review of Systems  Constitution: Negative for chills and fever.  HENT: Negative for hoarse voice and nosebleeds.   Eyes: Negative for discharge, double vision and pain.  Cardiovascular: Positive for chest pain and palpitations. Negative for claudication, dyspnea on exertion, leg swelling, near-syncope, orthopnea, paroxysmal nocturnal dyspnea and syncope.  Respiratory: Negative for hemoptysis and shortness of breath.   Musculoskeletal: Negative for muscle cramps and myalgias.  Gastrointestinal: Negative for abdominal pain, constipation, diarrhea, hematemesis, hematochezia, melena, nausea and vomiting.  Neurological: Negative for dizziness and light-headedness.    PHYSICAL EXAM: Vitals with BMI 01/06/2020 12/19/2019 12/18/2019  Height 6\' 0"  6\' 0"  -  Weight 240 lbs 2 oz 232 lbs -  BMI 51.88 41.66 -  Systolic 063 016 010  Diastolic 86 93 86  Pulse 79 122 90    CONSTITUTIONAL: Well-developed and well-nourished. No acute distress.  SKIN: Skin is warm and dry.  No rash noted. No cyanosis. No pallor. No jaundice HEAD: Normocephalic and atraumatic.  EYES: No scleral icterus MOUTH/THROAT: Moist oral membranes.  NECK: No JVD present. No thyromegaly noted. No carotid bruits  LYMPHATIC: No visible cervical adenopathy.  CHEST Normal respiratory effort. No intercostal retractions  LUNGS: Clear to auscultation bilaterally. No stridor. No  wheezes. No rales.  CARDIOVASCULAR: Regular rate and rhythm, positive S1-S2, no murmurs rubs or gallops appreciated. ABDOMINAL: No apparent ascites.  EXTREMITIES: No peripheral edema  HEMATOLOGIC: No significant bruising NEUROLOGIC: Oriented to person, place, and time. Nonfocal. Normal muscle tone.  PSYCHIATRIC: Normal mood and affect. Normal behavior. Cooperative  CARDIAC DATABASE: EKG: 01/06/2020: Normal sinus rhythm, 77 bpm, normal axis, nonspecific ST-T changes.  Prior EKG dated 12/19/2019 noted sinus tachycardia.  Echocardiogram: None  Stress Testing: None  Heart Catheterization: None  LABORATORY DATA: CBC Latest Ref Rng & Units 12/18/2019  WBC 4.0 - 10.5 K/uL 6.3  Hemoglobin 13.0 - 17.0 g/dL 19.3  Hematocrit 79.0 - 52.0 % 51.7  Platelets 150 - 400 K/uL 179    CMP Latest Ref Rng & Units 12/18/2019  Glucose 70 - 99 mg/dL 90  BUN 6 - 20 mg/dL 6  Creatinine 2.40 - 9.73 mg/dL 5.32  Sodium 992 - 426 mmol/L 137  Potassium 3.5 - 5.1 mmol/L 3.9  Chloride 98 - 111 mmol/L 102  CO2 22 - 32 mmol/L 22  Calcium 8.9 - 10.3 mg/dL 9.5    Lipid Panel  No results found for: CHOL, TRIG, HDL, CHOLHDL, VLDL, LDLCALC, LDLDIRECT, LABVLDL  No results found for: HGBA1C No components found for: NTPROBNP No results found for: TSH  BMP Recent Labs    12/18/19 0652  NA 137  K 3.9  CL 102  CO2 22  GLUCOSE 90  BUN 6  CREATININE 1.15  CALCIUM 9.5  GFRNONAA >60  GFRAA >60    IMPRESSION:    ICD-10-CM   1. Precordial chest pain  R07.2 EKG 12-Lead    PCV ECHOCARDIOGRAM COMPLETE    PCV CARDIAC STRESS TEST    SARS-COV-2 RNA,(COVID-19) QUAL NAAT  2. Palpitations  R00.2 Holter monitor - 48 hour     RECOMMENDATIONS: Keith Foster is a 29 y.o. male without any significant past cardiovascular history presents to the office as a self-referral for evaluation of chest pain and palpitations.  Chest pain:  Patient symptoms of chest discomfort are atypical in nature.  He still is  concerned that his symptoms may be ischemic driven.  Despite reassurance shared decision was to proceed with an echocardiogram as well as exercise treadmill stress test.  Echocardiogram will be ordered to evaluate for structural heart disease and left ventricular systolic function.  Plan exercise treadmill stress test to rule out exercise-induced ischemia.  Palpitations:  48-hour Holter monitor to evaluate for underlying arrhythmic burden.  FINAL MEDICATION LIST END OF ENCOUNTER: No orders of the defined types were placed in this encounter.   There are no discontinued medications.   Current Outpatient Medications:  .  metoprolol tartrate (LOPRESSOR) 50 MG tablet, Take 1/2 to 1 tablet twice daily for your fast heart rate., Disp: 30 tablet, Rfl: 0  Orders Placed This Encounter  Procedures  . SARS-COV-2 RNA,(COVID-19) QUAL NAAT  . PCV CARDIAC STRESS TEST  . Holter monitor - 48 hour  . EKG 12-Lead  . PCV ECHOCARDIOGRAM COMPLETE    There are no Patient Instructions on file for this visit.   --Continue cardiac medications as reconciled in final medication list. --Return in about  6 weeks (around 02/17/2020) for re-evaluation of symptoms., review test results.. Or sooner if needed. --Continue follow-up with your primary care physician regarding the management of your other chronic comorbid conditions.  Patient's questions and concerns were addressed to his satisfaction. He voices understanding of the instructions provided during this encounter.   This note was created using a voice recognition software as a result there may be grammatical errors inadvertently enclosed that do not reflect the nature of this encounter. Every attempt is made to correct such errors.  Tessa Lerner, [place], [facility]  Pager: [phone] Office: [phone]

## 2020-01-08 ENCOUNTER — Telehealth: Payer: Self-pay

## 2020-01-08 NOTE — Telephone Encounter (Signed)
Pt called back. He will be self pay

## 2020-01-08 NOTE — Telephone Encounter (Signed)
Spoke to pt re: insurance. He will call back to give correct info.

## 2020-01-09 ENCOUNTER — Other Ambulatory Visit (HOSPITAL_COMMUNITY): Payer: Self-pay

## 2020-01-14 ENCOUNTER — Other Ambulatory Visit: Payer: Self-pay

## 2020-01-14 ENCOUNTER — Ambulatory Visit: Payer: 59

## 2020-01-15 ENCOUNTER — Other Ambulatory Visit (HOSPITAL_COMMUNITY)
Admission: RE | Admit: 2020-01-15 | Discharge: 2020-01-15 | Disposition: A | Discharge status: Home / Self Care | Payer: HRSA Program | Source: Ambulatory Visit | Attending: Cardiology | Admitting: Cardiology

## 2020-01-15 DIAGNOSIS — Z20822 Contact with and (suspected) exposure to covid-19: Secondary | ICD-10-CM | POA: Diagnosis not present

## 2020-01-15 DIAGNOSIS — Z01812 Encounter for preprocedural laboratory examination: Secondary | ICD-10-CM | POA: Diagnosis present

## 2020-01-15 LAB — SARS CORONAVIRUS 2 (TAT 6-24 HRS): SARS Coronavirus 2: NEGATIVE

## 2020-01-20 ENCOUNTER — Emergency Department (HOSPITAL_COMMUNITY)
Admission: EM | Admit: 2020-01-20 | Discharge: 2020-01-20 | Disposition: A | Discharge status: Home / Self Care | Payer: Self-pay | Attending: Emergency Medicine | Admitting: Emergency Medicine

## 2020-01-20 ENCOUNTER — Encounter (HOSPITAL_COMMUNITY): Payer: Self-pay | Admitting: Emergency Medicine

## 2020-01-20 ENCOUNTER — Other Ambulatory Visit: Payer: Self-pay

## 2020-01-20 ENCOUNTER — Emergency Department (HOSPITAL_COMMUNITY): Payer: Self-pay

## 2020-01-20 DIAGNOSIS — Z5321 Procedure and treatment not carried out due to patient leaving prior to being seen by health care provider: Secondary | ICD-10-CM | POA: Insufficient documentation

## 2020-01-20 DIAGNOSIS — R002 Palpitations: Secondary | ICD-10-CM | POA: Insufficient documentation

## 2020-01-20 DIAGNOSIS — R0789 Other chest pain: Secondary | ICD-10-CM | POA: Insufficient documentation

## 2020-01-20 DIAGNOSIS — R0602 Shortness of breath: Secondary | ICD-10-CM | POA: Insufficient documentation

## 2020-01-20 LAB — CBC
HCT: 48.6 % (ref 39.0–52.0)
Hemoglobin: 15.1 g/dL (ref 13.0–17.0)
MCH: 22 pg — ABNORMAL LOW (ref 26.0–34.0)
MCHC: 31.1 g/dL (ref 30.0–36.0)
MCV: 70.9 fL — ABNORMAL LOW (ref 80.0–100.0)
Platelets: 172 10*3/uL (ref 150–400)
RBC: 6.85 MIL/uL — ABNORMAL HIGH (ref 4.22–5.81)
RDW: 17.5 % — ABNORMAL HIGH (ref 11.5–15.5)
WBC: 8.9 10*3/uL (ref 4.0–10.5)
nRBC: 0 % (ref 0.0–0.2)

## 2020-01-20 LAB — BASIC METABOLIC PANEL
Anion gap: 13 (ref 5–15)
BUN: 7 mg/dL (ref 6–20)
CO2: 22 mmol/L (ref 22–32)
Calcium: 9.4 mg/dL (ref 8.9–10.3)
Chloride: 105 mmol/L (ref 98–111)
Creatinine, Ser: 1.03 mg/dL (ref 0.61–1.24)
GFR calc Af Amer: >60 mL/min (ref >60)
GFR calc non Af Amer: >60 mL/min (ref >60)
Glucose, Bld: 105 mg/dL — ABNORMAL HIGH (ref 70–99)
Potassium: 4 mmol/L (ref 3.5–5.1)
Sodium: 140 mmol/L (ref 135–145)

## 2020-01-20 LAB — PROTIME-INR
INR: 0.9 (ref 0.8–1.2)
Prothrombin Time: 11.9 seconds (ref 11.4–15.2)

## 2020-01-20 LAB — TROPONIN I (HIGH SENSITIVITY): Troponin I (High Sensitivity): 3 ng/L (ref <18)

## 2020-01-20 MED ORDER — SODIUM CHLORIDE 0.9% FLUSH: 3.0000 mL | Freq: Once | INTRAVENOUS | Status: DC

## 2020-01-20 NOTE — ED Triage Notes (Signed)
Patient reports persistent central chest pain with palpitations and SOB this week , Denies emesis or diaphoresis , no cough or fever .

## 2020-01-21 ENCOUNTER — Ambulatory Visit (HOSPITAL_COMMUNITY)
Admission: EM | Admit: 2020-01-21 | Discharge: 2020-01-21 | Disposition: A | Discharge status: Home / Self Care | Payer: Self-pay | Attending: Family Medicine | Admitting: Family Medicine

## 2020-01-21 ENCOUNTER — Encounter (HOSPITAL_COMMUNITY): Payer: Self-pay

## 2020-01-21 ENCOUNTER — Other Ambulatory Visit: Payer: Self-pay

## 2020-01-21 DIAGNOSIS — R0789 Other chest pain: Secondary | ICD-10-CM | POA: Insufficient documentation

## 2020-01-21 MED ORDER — PREDNISONE 10 MG (21) PO TBPK
ORAL_TABLET | Freq: Every day | ORAL | 0 refills | Status: DC
Start: 2020-01-21 — End: 2020-02-17

## 2020-01-21 NOTE — ED Provider Notes (Signed)
[facility]   [id number] 01/21/20 Arrival Time: 1540  ASSESSMENT & PLAN:  1. Chest wall pain     Patient history and exam consistent with non-cardiac cause of chest pain.  Reassured. To keep scheduled f/u with cardiology. Discussed costochondral pain. Question anxiety component of current symptoms.  ECG: Performed today and interpreted by me: normal EKG, normal sinus rhythm. No STEMI. Compared to ECG dated 01/20/2020: no change.  No indication for chest imaging. CXR dated 01/20/2020 reviewed by me.  Begin trial of: Meds ordered this encounter  Medications  . predniSONE (STERAPRED UNI-PAK 21 TAB) 10 MG (21) TBPK tablet    Sig: Take by mouth daily. Take as directed.    Dispense:  21 tablet    Refill:  0    Chest pain precautions given. Work note provided. Reviewed expectations re: course of current medical issues. Questions answered. Outlined signs and symptoms indicating need for more acute intervention. Patient verbalized understanding. After Visit Summary given.   SUBJECTIVE:  History from: patient. Keith Foster is a 29 y.o. male who presents with complaint of fairly persistent anterior chest pain. On/off over the past 1-1.5 months. Seen here on 12/19/19; note reviewed. Has been seen by cardiology and is scheduled for an ECHO. No specific change in symptoms; 'just still hurting' and is worried about this. Does interfere with sleep at times. No associated SOB/n/v. Works at [employer].  Denies: irregular heart beat, lower extremity edema, near-syncope, orthopnea, palpitations, paroxysmal nocturnal dyspnea and syncope. Aggravating factors: have not been identified. Alleviating factors: have not been identified. Questions if certain movements worsen, esp rolling over in bed at night. Recent illnesses: none. Fever: absent. Ambulatory without assistance. Self/OTC treatment: various OTC analgesics without much relief.  Illicit drug use: denies.  Social History    Tobacco Use  Smoking Status Former Smoker  . Packs/day: 0.25  . Years: 5.00  . Pack years: 1.25  . Types: Cigars  Smokeless Tobacco Never Used   Social History   Substance and Sexual Activity  Alcohol Use No     OBJECTIVE:  Vitals:   01/21/20 1601  BP: 127/71  Pulse: 93  Resp: 20  Temp: 98.5 F (36.9 C)  TempSrc: Oral  SpO2: 100%    General appearance: alert, oriented, no acute distress Eyes: PERRLA; EOMI; conjunctivae normal HENT: normocephalic; atraumatic Neck: supple with FROM Lungs: without labored respirations; speaks full sentences without difficulty; CTAB Heart: regular rate and rhythm Chest Wall: with tenderness to palpation over bilateral anterior chest wall Abdomen: soft, non-tender; no guarding or rebound tenderness Extremities: without edema; without calf swelling or tenderness; symmetrical without gross deformities Skin: warm and dry; without rash or lesions Neuro: normal gait Psychological: alert and cooperative; normal mood and affect; does appear anxious  Labs Reviewed: Results for orders placed or performed during the hospital encounter of 01/20/20  Basic metabolic panel  Result Value Ref Range   Sodium 140 135 - 145 mmol/L   Potassium 4.0 3.5 - 5.1 mmol/L   Chloride 105 98 - 111 mmol/L   CO2 22 22 - 32 mmol/L   Glucose, Bld 105 (H) 70 - 99 mg/dL   BUN 7 6 - 20 mg/dL   Creatinine, Ser 5.17 0.61 - 1.24 mg/dL   Calcium 9.4 8.9 - 61.6 mg/dL   GFR calc non Af Amer >60 >60 mL/min   GFR calc Af Amer >60 >60 mL/min   Anion gap 13 5 - 15  CBC  Result Value Ref Range  WBC 8.9 4.0 - 10.5 K/uL   RBC 6.85 (H) 4.22 - 5.81 MIL/uL   Hemoglobin 15.1 13.0 - 17.0 g/dL   HCT 48.6 39.0 - 52.0 %   MCV 70.9 (L) 80.0 - 100.0 fL   MCH 22.0 (L) 26.0 - 34.0 pg   MCHC 31.1 30.0 - 36.0 g/dL   RDW 17.5 (H) 11.5 - 15.5 %   Platelets 172 150 - 400 K/uL   nRBC 0.0 0.0 - 0.2 %  Protime-INR (order if Patient is taking Coumadin / Warfarin)  Result Value Ref  Range   Prothrombin Time 11.9 11.4 - 15.2 seconds   INR 0.9 0.8 - 1.2  Troponin I (High Sensitivity)  Result Value Ref Range   Troponin I (High Sensitivity) 3 <18 ng/L    Imaging reviewed: DG Chest 2 View  Result Date: 01/20/2020 CLINICAL DATA:  Central chest pain, tachycardia and shortness of breath EXAM: CHEST - 2 VIEW COMPARISON:  Radiograph 12/18/2019 FINDINGS: Low volumes with streaky atelectasis with central vascular crowding. No consolidation, features of edema, pneumothorax, or effusion. The cardiomediastinal contours are unremarkable. No acute osseous or soft tissue abnormality. Chronic elevation of the left distal clavicle relative to the acromion may reflect sequela of prior [facility] joint injury. The right [facility] joint is not visualized for comparison. IMPRESSION: Low volumes with streaky atelectasis with central vascular crowding. Electronically Signed   By: Lovena Le M.D.   On: 01/20/2020 01:01     Allergies  Allergen Reactions  . Banana     Eye lid swelling, nasal congestion      Social History   Socioeconomic History  . Marital status: Single    Spouse name: Not on file  . Number of children: 2  . Years of education: Not on file  . Highest education level: Not on file  Occupational History  . Not on file  Tobacco Use  . Smoking status: Former Smoker    Packs/day: 0.25    Years: 5.00    Pack years: 1.25    Types: Cigars  . Smokeless tobacco: Never Used  Substance and Sexual Activity  . Alcohol use: No  . Drug use: No  . Sexual activity: Yes    Birth control/protection: Condom  Other Topics Concern  . Not on file  Social History Narrative  . Not on file   Social Determinants of Health   Financial Resource Strain:   . Difficulty of Paying Living Expenses:   Food Insecurity:   . Worried About Charity fundraiser in the Last Year:   . Arboriculturist in the Last Year:   Transportation Needs:   . Film/video editor (Medical):   Marland Kitchen Lack of  Transportation (Non-Medical):   Physical Activity:   . Days of Exercise per Week:   . Minutes of Exercise per Session:   Stress:   . Feeling of Stress :   Social Connections:   . Frequency of Communication with Friends and Family:   . Frequency of Social Gatherings with Friends and Family:   . Attends Religious Services:   . Active Member of Clubs or Organizations:   . Attends Archivist Meetings:   Marland Kitchen Marital Status:   Intimate Partner Violence:   . Fear of Current or Ex-Partner:   . Emotionally Abused:   Marland Kitchen Physically Abused:   . Sexually Abused:    Family History  Problem Relation Age of Onset  . Healthy Mother   . Healthy Father  Past Surgical History:  Procedure Laterality Date  . ANKLE SURGERY       Mardella Layman, MD 01/21/20 1739

## 2020-01-21 NOTE — ED Triage Notes (Signed)
Pt reports chest pain x 1 month. "Heart feels like jerking and jumping". Pt states he is sweating and  hands are shaking. Pt was seen at the ED yesterday for the same chief complaint. Pt was evaluated by Cardiology on 01/06/2020 and have an appointment for holter monitor and echocardiogram on 01/29/2020.

## 2020-01-29 ENCOUNTER — Other Ambulatory Visit: Payer: Self-pay

## 2020-01-29 ENCOUNTER — Ambulatory Visit: Payer: Self-pay

## 2020-01-29 DIAGNOSIS — R002 Palpitations: Secondary | ICD-10-CM

## 2020-02-01 ENCOUNTER — Encounter: Payer: Self-pay | Admitting: Internal Medicine

## 2020-02-17 ENCOUNTER — Encounter: Payer: Self-pay | Admitting: Cardiology

## 2020-02-17 ENCOUNTER — Ambulatory Visit: Payer: Self-pay | Admitting: Cardiology

## 2020-02-17 ENCOUNTER — Other Ambulatory Visit: Payer: Self-pay

## 2020-02-17 VITALS — BP 129/80 | HR 95 | Resp 16 | Ht 72.0 in | Wt 238.4 lb

## 2020-02-17 DIAGNOSIS — R002 Palpitations: Secondary | ICD-10-CM

## 2020-02-17 DIAGNOSIS — Z87891 Personal history of nicotine dependence: Secondary | ICD-10-CM

## 2020-02-17 DIAGNOSIS — R072 Precordial pain: Secondary | ICD-10-CM

## 2020-02-17 NOTE — Progress Notes (Signed)
Date:  02/17/2020   ID:  Hosie Spangle, DOB 1990/12/17, MRN [id number]  PCP:  Patient, No Pcp Per  Cardiologist:  Rex Kras, DO, [facility] (established care 01/05/2020)  Date: 02/17/20 Last Office Visit: 01/06/2020  Chief Complaint  Patient presents with  . Precordial chest pains  . Follow-up    6 weeks  . Results    HPI  Keith Foster is a 29 y.o. male who presents with a cc " follow up regarding chest pain and review test results."   Patient was last seen in the office back in May 2021 for evaluation of chest pain after being seen at the ER.  At last office visit patient noted that his chest pain was located anteriorly, radiating to bilateral arms is a tingling-like sensation, intensity 6-7/10, ache-like sensation not associated with effort related activities nor does it improve with rest.  The discomfort usually self-limited.    Despite reassuring that his symptoms are atypical patient requested additional work-up and therefore recommended an echocardiogram and GXT.  In addition, due to his symptoms of palpitation a 48-hour Holter monitor was recommended.  Patient did have an echocardiogram and a monitor performed since last office visit.  The echocardiogram notes preserved left ventricular systolic function without any significant valvular heart disease.  Monitor results were discussed with him in great detail which noted an average heart rate of 94 bpm and tachycardia burden of 43%.  Patient was already prescribed metoprolol at home but chooses not to be on pharmacological therapy.    Due to financial constraints he was unable to get the GXT done since last office visit.    Today continues to have similar symptoms as discussed above and has gone to the ER for reevaluation since last office visit.  I reviewed the ER results with him during the office visit.  Patient plans to follow-[facility] with [facility] to reestablish care with primary as of March 10, 2020.  Currently does  not have chest pain.  No family history of premature coronary disease or sudden cardiac death.  No prior history of syncope.  Denies prior history of coronary artery disease, myocardial infarction, congestive heart failure, deep venous thrombosis, pulmonary embolism, stroke, transient ischemic attack.  FUNCTIONAL STATUS: Plays basketball at least once a week.  Goes to the gym for 1 to 2 hours several times a week.  ALLERGIES: Allergies  Allergen Reactions  . Banana     Eye lid swelling, nasal congestion    MEDICATION LIST PRIOR TO VISIT: Current Meds  Medication Sig  . metoprolol tartrate (LOPRESSOR) 50 MG tablet Take 1/2 to 1 tablet twice daily for your fast heart rate.     PAST MEDICAL HISTORY: History reviewed. No pertinent past medical history.  PAST SURGICAL HISTORY: Past Surgical History:  Procedure Laterality Date  . ANKLE SURGERY      FAMILY HISTORY: The patient family history includes Healthy in his father and mother.  SOCIAL HISTORY:  The patient  reports that he has quit smoking. His smoking use included cigars. He has a 1.25 pack-year smoking history. He has never used smokeless tobacco. He reports that he does not drink alcohol and does not use drugs.  REVIEW OF SYSTEMS: Review of Systems  Constitutional: Negative for chills and fever.  HENT: Negative for hoarse voice and nosebleeds.   Eyes: Negative for discharge, double vision and pain.  Cardiovascular: Positive for chest pain (no active pain; similar to prior events and intermittent ) and palpitations. Negative  for claudication, dyspnea on exertion, leg swelling, near-syncope, orthopnea, paroxysmal nocturnal dyspnea and syncope.  Respiratory: Negative for hemoptysis and shortness of breath.   Musculoskeletal: Negative for muscle cramps and myalgias.  Gastrointestinal: Negative for abdominal pain, constipation, diarrhea, hematemesis, hematochezia, melena, nausea and vomiting.  Neurological: Negative for  dizziness and light-headedness.    PHYSICAL EXAM: Vitals with BMI 02/17/2020 01/21/2020 01/20/2020  Height 6\' 0"  - 6\' 0"   Weight 238 lbs 6 oz - 242 lbs 8 oz  BMI 32.33 - 32.88  Systolic 129 127  Diastolic 80 71 90  Pulse 95 93 117    CONSTITUTIONAL: Well-developed and well-nourished. No acute distress.  SKIN: Skin is warm and dry. No rash noted. No cyanosis. No pallor. No jaundice HEAD: Normocephalic and atraumatic.  EYES: No scleral icterus MOUTH/THROAT: Moist oral membranes.  NECK: No JVD present. No thyromegaly noted. No carotid bruits  LYMPHATIC: No visible cervical adenopathy.  CHEST Normal respiratory effort. No intercostal retractions  LUNGS: Clear to auscultation bilaterally. No stridor. No wheezes. No rales.  CARDIOVASCULAR: Regular rate and rhythm, positive S1-S2, no murmurs rubs or gallops appreciated. ABDOMINAL: No apparent ascites.  EXTREMITIES: No peripheral edema  HEMATOLOGIC: No significant bruising NEUROLOGIC: Oriented to person, place, and time. Nonfocal. Normal muscle tone.  PSYCHIATRIC: Normal mood and affect. Normal behavior. Cooperative  CARDIAC DATABASE: EKG: 01/06/2020: Normal sinus rhythm, 77 bpm, normal axis, nonspecific ST-T changes.  Prior EKG dated 12/19/2019 noted sinus tachycardia.  Echocardiogram: 01/29/2020:  Left ventricle cavity is normal in size. Mild concentric hypertrophy of the left ventricle. Normal global wall motion. Normal LV systolic function with EF 59%. Normal diastolic filling pattern.  Left atrial cavity is mildly dilated.  Mild mitral regurgitation.  Inadequate TR jet to estimate pulmonary artery systolic pressure. Normal right atrial pressure.   Stress Testing: None  Heart Catheterization: None  48 hour Holter monitor:  Dominant rhythm normal sinus, followed by sinus tachycardia (burden 43%).  Heart rate 45-175 bpm. Average heart rate 94 bpm.  No atrial fibrillation/atrial flutter/supraventricular tachycardia/ventricular  tachycardia/high grade AV block, sinus pause greater than or equal to 3 seconds in duration.  Total ventricular ectopic burden 0.01%.  Number of patient triggered events: 0.   LABORATORY DATA: CBC Latest Ref Rng & Units 01/20/2020 12/18/2019  WBC 4.0 - 10.5 K/uL 8.9 6.3  Hemoglobin 13.0 - 17.0 g/dL 01/22/2020 12/20/2019  Hematocrit 39 - 52 % 48.6 51.7  Platelets 150 - 400 K/uL 172 179    CMP Latest Ref Rng & Units 01/20/2020 12/18/2019  Glucose 70 - 99 mg/dL 01/22/2020) 90  BUN 6 - 20 mg/dL 7 6  Creatinine 12/20/2019 - 1.24 mg/dL 710(G 2.69  Sodium 4.85 - 145 mmol/L 140 137  Potassium 3.5 - 5.1 mmol/L 4.0 3.9  Chloride 98 - 111 mmol/L 105 102  CO2 22 - 32 mmol/L 22 22  Calcium 8.9 - 10.3 mg/dL 9.4 9.5    Lipid Panel  No results found for: CHOL, TRIG, HDL, CHOLHDL, VLDL, LDLCALC, LDLDIRECT, LABVLDL  No results found for: HGBA1C No components found for: NTPROBNP No results found for: TSH  BMP Recent Labs    12/18/19 0652 01/20/20 0034  NA 137 140  K 3.9 4.0  CL 102 105  CO2 22 22  GLUCOSE 90 105*  BUN 6 7  CREATININE 1.15 1.03  CALCIUM 9.5 9.4  GFRNONAA >60 >60  GFRAA >60 >60    IMPRESSION:    ICD-10-CM   1. Precordial chest pain  R07.2  2. Palpitations  R00.2   3. Former smoker  Z87.891      RECOMMENDATIONS: Keith Foster is a 29 y.o. male without any significant past cardiovascular history presents to the office as a self-referral for evaluation of chest pain and palpitations.  Chest pain:  His symptoms do appear to be atypical in nature.  However, did recommend an echocardiogram and treadmill stress test at the last office visit.  Patient was able to get the echocardiogram performed and the results were reviewed with him in great detail and noted above further reference.    Patient plans to get the exercise treadmill stress test done in the near future.  We also discussed possibly having her coronary artery calcifications for further risk stratification; however, due to  financial limitations patient would like to hold off on this at the current time.  Also recommended to discuss noncardiac causes of chest discomfort with his primary care provider with them on March 10, 2020.    Patient is asked to call the office and if unable to reach go to the ER if his symptoms were to worsen in intensity, frequency, and/or duration.  He verbalizes understanding and provides feedback.    Palpitations:  Results of the Holter monitor were reviewed with the patient.  Recommended initiation of a low-dose aVL blocking agent such as calcium channel blocker.  However, patient does not want to be on pharmacological therapy.  Recommend checking TSH at his upcoming appointment with primary team.  FINAL MEDICATION LIST END OF ENCOUNTER: No orders of the defined types were placed in this encounter.   Medications Discontinued During This Encounter  Medication Reason  . predniSONE (STERAPRED UNI-PAK 21 TAB) 10 MG (21) TBPK tablet Prescription never filled     Current Outpatient Medications:  .  metoprolol tartrate (LOPRESSOR) 50 MG tablet, Take 1/2 to 1 tablet twice daily for your fast heart rate., Disp: 30 tablet, Rfl: 0  No orders of the defined types were placed in this encounter.  --Continue cardiac medications as reconciled in final medication list. --Return in about 4 weeks (around 03/16/2020) for after stress test . Or sooner if needed. --Continue follow-up with your primary care physician regarding the management of your other chronic comorbid conditions.  Patient's questions and concerns were addressed to his satisfaction. He voices understanding of the instructions provided during this encounter.   This note was created using a voice recognition software as a result there may be grammatical errors inadvertently enclosed that do not reflect the nature of this encounter. Every attempt is made to correct such errors.  Tessa Lerner, [place], [facility]  Pager:  [phone] Office: [phone]

## 2020-03-10 ENCOUNTER — Other Ambulatory Visit: Payer: Self-pay

## 2020-03-10 ENCOUNTER — Ambulatory Visit: Payer: Self-pay | Attending: Internal Medicine | Admitting: Family

## 2020-03-10 DIAGNOSIS — R072 Precordial pain: Secondary | ICD-10-CM

## 2020-03-10 DIAGNOSIS — F419 Anxiety disorder, unspecified: Secondary | ICD-10-CM

## 2020-03-10 DIAGNOSIS — Z1329 Encounter for screening for other suspected endocrine disorder: Secondary | ICD-10-CM

## 2020-03-10 DIAGNOSIS — R002 Palpitations: Secondary | ICD-10-CM

## 2020-03-10 NOTE — Patient Instructions (Signed)
Keep appointments with Cardiology. Referral to Social Work. Follow-up with primary physician as needed. Lab within the next week. Chest Wall Pain Chest wall pain is pain in or around the bones and muscles of your chest. Chest wall pain may be caused by:  An injury.  Coughing a lot.  Using your chest and arm muscles too much. Sometimes, the cause may not be known. This pain may take a few weeks or longer to get better. Follow these instructions at home: Managing pain, stiffness, and swelling If told, put ice on the painful area:  Put ice in a plastic bag.  Place a towel between your skin and the bag.  Leave the ice on for 20 minutes, 2-3 times a day.  Activity  Rest as told by your doctor.  Avoid doing things that cause pain. This includes lifting heavy items.  Ask your doctor what activities are safe for you. General instructions   Take over-the-counter and prescription medicines only as told by your doctor.  Do not use any products that contain nicotine or tobacco, such as cigarettes, e-cigarettes, and chewing tobacco. If you need help quitting, ask your doctor.  Keep all follow-up visits as told by your doctor. This is important. Contact a doctor if:  You have a fever.  Your chest pain gets worse.  You have new symptoms. Get help right away if:  You feel sick to your stomach (nauseous) or you throw up (vomit).  You feel sweaty or light-headed.  You have a cough with mucus from your lungs (sputum) or you cough up blood.  You are short of breath. These symptoms may be an emergency. Do not wait to see if the symptoms will go away. Get medical help right away. Call your local emergency services (911 in the [place] Do not drive yourself to the hospital. Summary  Chest wall pain is pain in or around the bones and muscles of your chest.  It may be treated with ice, rest, and medicines. Your condition may also get better if you avoid doing things that cause  pain.  Contact a doctor if you have a fever, chest pain that gets worse, or new symptoms.  Get help right away if you feel light-headed or you get short of breath. These symptoms may be an emergency. This information is not intended to replace advice given to you by your health care provider. Make sure you discuss any questions you have with your health care provider. Document Revised: 02/13/2018 Document Reviewed: 02/13/2018 Elsevier Patient Education  2020 [employer].

## 2020-03-10 NOTE — Progress Notes (Signed)
Virtual Visit via Telephone Note  I connected with Keith Foster, on 03/10/2020 at 8:43 AM by telephone due to the COVID-19 pandemic and verified that I am speaking with the correct person using two identifiers.  Due to current restrictions/limitations of in-office visits due to the COVID-19 pandemic, this scheduled clinical appointment was converted to a telehealth visit.   Consent: I discussed the limitations, risks, security and privacy concerns of performing an evaluation and management service by telephone and the availability of in person appointments. I also discussed with the patient that there may be a patient responsible charge related to this service. The patient expressed understanding and agreed to proceed.  Location of Patient: Home  Location of Provider: [facility]   Persons participating in Telemedicine visit: Keary Schryver Kierre Hintz HeadrickStephens, NP Laruth BouchardJaya Pollock, [place]   History of Present Illness: Keith Rainsavon Legrand is a 29 year old male with no significant past medical history and presents today with chest pain and palpitations follow-up.    1. CHEST PAIN FOLLOW-UP: Last visit 02/17/2020 with cardiology Dr. Odis Hollingsheadolia. During that encounter symptoms appeared atypical in nature. Echocardiogram performed and results were reviewed with patient in great detail. Patient had plans to get exercise treadmill stress test done in the future. Discussion was made about possible coronary artery calcifications for further risk stratification but were put on hold due to financial limitations. Recommendations to discuss noncardiac causes of chest discomfort with primary care provider at next appointment.  Today patient reports he is still having chest pain almost everyday which comes and goes. Reports chest pain is stable since last visit with cardiology where chest pain was described as located anteriorly, radiating to bilateral arms with tingling-like sensation, intensity  6-7/10, ache-like sensation not associated with effort related activities and does not improve with rest. Describes discomfort as self-limited. Reports he takes Ibuprofen sometimes for chest pain.  2. PALPITATIONS FOLLOW-UP:  Last visit 02/17/2020 with cardiology Dr. Odis Hollingsheadolia. During that encounter results of Holter monitor were reviewed with the patient. Recommended initiation of a low-dose aVL blocking agent such as calcium channel blocker. Patient verbalized not wanting to be on pharmacological therapy. Recommendation to check TSH at appointment with primary provider. Metoprolol prescribed for palpitations.  3. THYROID SCREENING: Fatigue: yes and thinks this is related to working a lot of hours at his job Cold intolerance: no Heat intolerance: no Weight gain: no Weight loss: yes lost 50 pounds in 2 months, went from 280 pounds to currently 235 pounds, this was not intentional weight loss and feels this is related to stress  Constipation: no Diarrhea/loose stools: no Palpitations: yes Lower extremity edema: no Anxiety/depressed mood: yes anxiety mood because he is worried about the unknown with his condition   4. ANXIETY/STRESS: Anxious mood: yes  Excessive worrying: yes Irritability: no but trys to stay to himself  Sweating: yes during sleep especially  Nausea: no Palpitations:yes Hyperventilation: no Panic attacks: no Agoraphobia: no  Obscessions/compulsions: no Depressed mood: yes No flowsheet data found. Anhedonia: no Weight changes: no Insomnia: yes  Hypersomnia: no Fatigue/loss of energy: yes Feelings of worthlessness: feeling down  Feelings of guilt: no Impaired concentration/indecisiveness: no Suicidal ideations: no  Thoughts of self-harm: no  Crying spells: yes  Family stress: yes    Job stress: yes      No past medical history on file. Allergies  Allergen Reactions  . Banana     Eye lid swelling, nasal congestion    Current Outpatient Medications on File  Prior to Visit  Medication Sig Dispense Refill  . metoprolol tartrate (LOPRESSOR) 50 MG tablet Take 1/2 to 1 tablet twice daily for your fast heart rate. 30 tablet 0  . [DISCONTINUED] diphenhydrAMINE (BENADRYL) 25 MG tablet Take 1 tablet (25 mg total) by mouth every 6 (six) hours as needed for itching (Rash). 30 tablet 0   No current facility-administered medications on file prior to visit.    Observations/Objective: Alert and oriented x 3. Not in acute distress. Physical examination not completed as this is a telemedicine visit.  Assessment and Plan: 1. Precordial chest pain: -Today patient reports he is still having chest pain almost everyday which comes and goes. Reports chest pain is stable since last visit with cardiology where chest pain was described as located anteriorly, radiating to bilateral arms with tingling-like sensation, intensity 6-7/10, ache-like sensation not associated with effort related activities and does not improve with rest. Describes discomfort as self-limited. Reports he takes Ibuprofen sometimes for chest pain.  -Reports he will be unable to attend appointment for stress test scheduled on 03/16/2020 as he will be vacationing at that time. Reports he plans to reschedule the stress test but unsure of when that will be. -Keep appointments with Cardiology. -Follow-up with primary physician as needed.  -Patient was given clear instructions to go to Emergency Department or return to medical center if symptoms don't improve, worsen, or new problems develop.The patient verbalized understanding.  2. Palpitations: 3. Thyroid disorder screen: -Today patient reports palpitations are the same since last visit with cardiology. Reports he does not take Metoprolol as prescribed because he does not like to take medication. Reports the last time he took this medication was sometime over the past weekend. -Considering patient having palpitations will need to rule out thyroid-related  etiology.  -Thyroid panel to screen thyroid function. Patient reports he cannot come in this week for lab because of his work obligations. Reports he will come in when he can to have this done. -Patient was given clear instructions to go to Emergency Department or return to medical center if symptoms don't improve, worsen, or new problems develop.The patient verbalized understanding. - TSH+T4F+T3Free; Future  4. Anxiety: -Likely life-related stress and anxiety has some contrubution to chest pain and palpitations. Patient explains that he has both family and work-related stress. Declines pharmacological therapy at this time. Denies thoughts of self-harm and suicidal ideation.  -Referral to Social Work for counseling and community resources.  -Follow-up with primary physician as needed. - Ambulatory referral to Social Work -[employer] Resources:   What if I or someone I know is in crisis?  . If you are thinking about harming yourself or having thoughts of suicide, or if you know someone who is, seek help right away.  . Call your doctor or mental health care provider.  . Call 911 or go to a hospital emergency room to get immediate help, or ask a friend or family member to help you do these things.  . Call the [place] National Suicide Prevention Lifeline's toll-free, 24-hour hotline at 1-800-273-TALK (936)599-5810) or TTY: 1-800-799-4 TTY (289)047-3726) to talk to a trained counselor.  . If you are in crisis, make sure you are not left alone.   . If someone else is in crisis, make sure he or she is not left alone   24 Hour :   [place] National Suicide Hotline: (605) 017-9062  [facility] Mobile Crisis: 972-148-9362   [facility]  [address] [place], [place] [address]  6285727813 or (610) [phone]  [facility] the [employer] (Domestic Violence, Rape & Victim Assistance)  (567) 578-5007  [facility]   [address], [place]  [address]   [phone] or [phone]   [facility] [employer] [facility]: [phone] (8am-4pm) or (414)435-96289176449871 (after hours)           [facility], [address], [place], [place]  [phone] Fax: [phone] [email or web address]  *Interpreters available *Accepts Medicaid, Medicare, uninsured  [facility]   Mon-Fri: 8am-5pm [address], [place], [place] [phone](SWNIO); (807)[phone](fax) [email or web address]  *Accepts Medicare  [facility] [place], Fri: 8am-4pm [address] [place], [place]  [phone] (phone); [phone] (fax) ExShows.dk  *Accepts Medicare  [facility] Mon-Fri: 9am-5pm  [address] [place], [place] [phone] (phone); [phone]  MommyCollege.dk  *Accepts Medicaid  [facility] [address] [place], [place]  [phone]  https://www.grant.info/   [facility], 8:30am-12pm/1pm-2:30pm [address] [place], [place] [phone] (phone); [phone] (fax) www.fspcares.org  *Accepts Medicaid, sliding-scale*Bilingual services available  [facility] Mon-Fri, 8am-7pm [address] [place], [place]  [phone](BHALP); ([phone](fax) www.[email or web address]  *Accepts Medicaid *Bilingual services available  [facility] Mon-Fri: 8am-5pm, Saturday by appointment only [address] [place], [place], [place] [phone] (phone); [phone] (fax) www.[email or web address]   [facility] [address], [place], [place] [phone] www.[email or web address]  *Free & reduced services for uninsured and underinsured individuals *Bilingual services for  Spanish-speaking clients 21 and under  [facility], [address] [place], [place] [phone](UDJSH); [phone](fax) KittenExchange.[email or web address]  *Bring your own interpreter at first visit *Accepts Medicare and [facility]  [facility] Mon-Fri: 9am-5:30pm [address], [place], [place] [phone] (phone), [phone] (fax) After hours crisis line: (701)124-0130 www.[email or web address]  *Accepts Medicare and Medicaid  [employer] [facility], 8am-6pm [address] [place], [place]  [phone] (phone); [phone] (fax) http://[email or web address]  *Subsidized costs available  [facility] Mon-Fri: 8am-4pm [address], [place], [place] [phone](QPRFF); ([phone](fax) www.p[email or web address]  *Accepts Medicaid  [facility] Same day access hours: Mon-Fri, 8:30-3pm Crisis hours: Mon-Fri, 8am-5pm [address], [place], [place]  [facility] [employer] Same day access hours: Mon-Fri, 8:30-3pm Crisis hours: Mon-Fri, 8am-8pm [address] [place], [place] [phone] (phone); ([phone] (fax) www.[email or web address]  *Accepts Medicaid and Medicare  [facility] [place], [place], Fri: 9am-9pm Tues, Thurs: 9am-6pm [address] [place], [place], [place]  [phone] (phone); [phone] (fax) https://[email or web address]  *(Accepts Medicare and Medicaid; payment plans available)*Bilingual services available  [facility] [address] [place], [place] [phone] (phone); ([phone] (fax) www.[email or web address]   [facility] [address], [place], [place]  [phone]  RackRewards.fr  *Bilingual services available  [facility]) Mon-Thurs: 8am-8pm [address], [place],  [place] [phone] (phone); [phone] (fax) ScrapbookLive.si  *Accepts Medicaid*Bilingual services available  [facility] [address], [place], [place] [phone] (phone) BrotherBig.at  *Accepts Medicaid *Bilingual services available  [facility] Mon-Fri, 9am-4:45pm [address], [place], [place] [phone] (phone); ([phone] (fax) http://[email or web address]-[email or web address]  *Accepts Medicare  [facility] Mon-Thurs: 8:30-8pm, Fri: 8:30am-7pm [address] [place], [place] (3rd floor) [phone] (phone); [phone] (fax) https://www.[email or web address]  *Accepts Medicaid; income-based reduced rates available  [facility] Mon-Fri: 8am-5pm [address], [place], [place] [phone] (phone); [phone] (fax) http://www.[email or web address]  *Accepts Medicaid*Bilingual services available  [facility] [address]  [address], [place], [place]  [phone] (phone); [phone] (fax) www.youthfocus.org  *Free emergency housing and clinical services for youth in crisis  [facility] ([facility] [place])  [address], [place] [phone] www.mhag.org  *Provides direct services to individuals in recovery from mental illness, including support groups, recovery skills classes, and one on one peer support  [employer] on Mental Illness) [place] helpline: 360-725-2483  https://namiguilford.org  *A community hub for information relating to local resources and services for the friends and families of individuals living alongside a mental health condition, as well as the individuals themselves. Classes and support groups also provided   Follow Up Instructions: Keep appointment with Cardiology. Follow-up with primary physician as needed.   Patient was given clear instructions to go to Emergency Department or return  to medical center if symptoms don't improve, worsen, or new problems develop.The patient verbalized understanding.  I discussed the assessment and treatment plan with the patient. The patient was provided an opportunity to ask questions and all were answered. The patient agreed with the plan and demonstrated an understanding of the instructions.   The patient was advised to call back or seek an in-person evaluation if the symptoms worsen or if the condition fails to improve as anticipated.   I provided 10 minutes total of non-face-to-face time during this encounter including median intraservice time, reviewing previous notes, labs, imaging, medications, management and patient verbalized understanding.    Rema Fendt, NP  [facility] [place], [place] [phone]   03/10/2020, 8:43 AM

## 2020-03-11 ENCOUNTER — Ambulatory Visit: Payer: Self-pay | Admitting: Cardiology

## 2020-03-23 ENCOUNTER — Telehealth: Payer: Self-pay | Admitting: Licensed Clinical Social Worker

## 2020-03-23 NOTE — Telephone Encounter (Signed)
Call placed regarding [facility] referral. Pt's voicemail was not setup; therefore, LCSW was unable to leave a message

## 2022-03-06 IMAGING — DX DG CHEST 2V
2 series · 2 of 2 positions shown · non-contrast
Comparison: 05/02/2011

CLINICAL DATA: Shortness of breath and chest pain this morning.

EXAM:
CHEST - 2 VIEW

[chest pa]
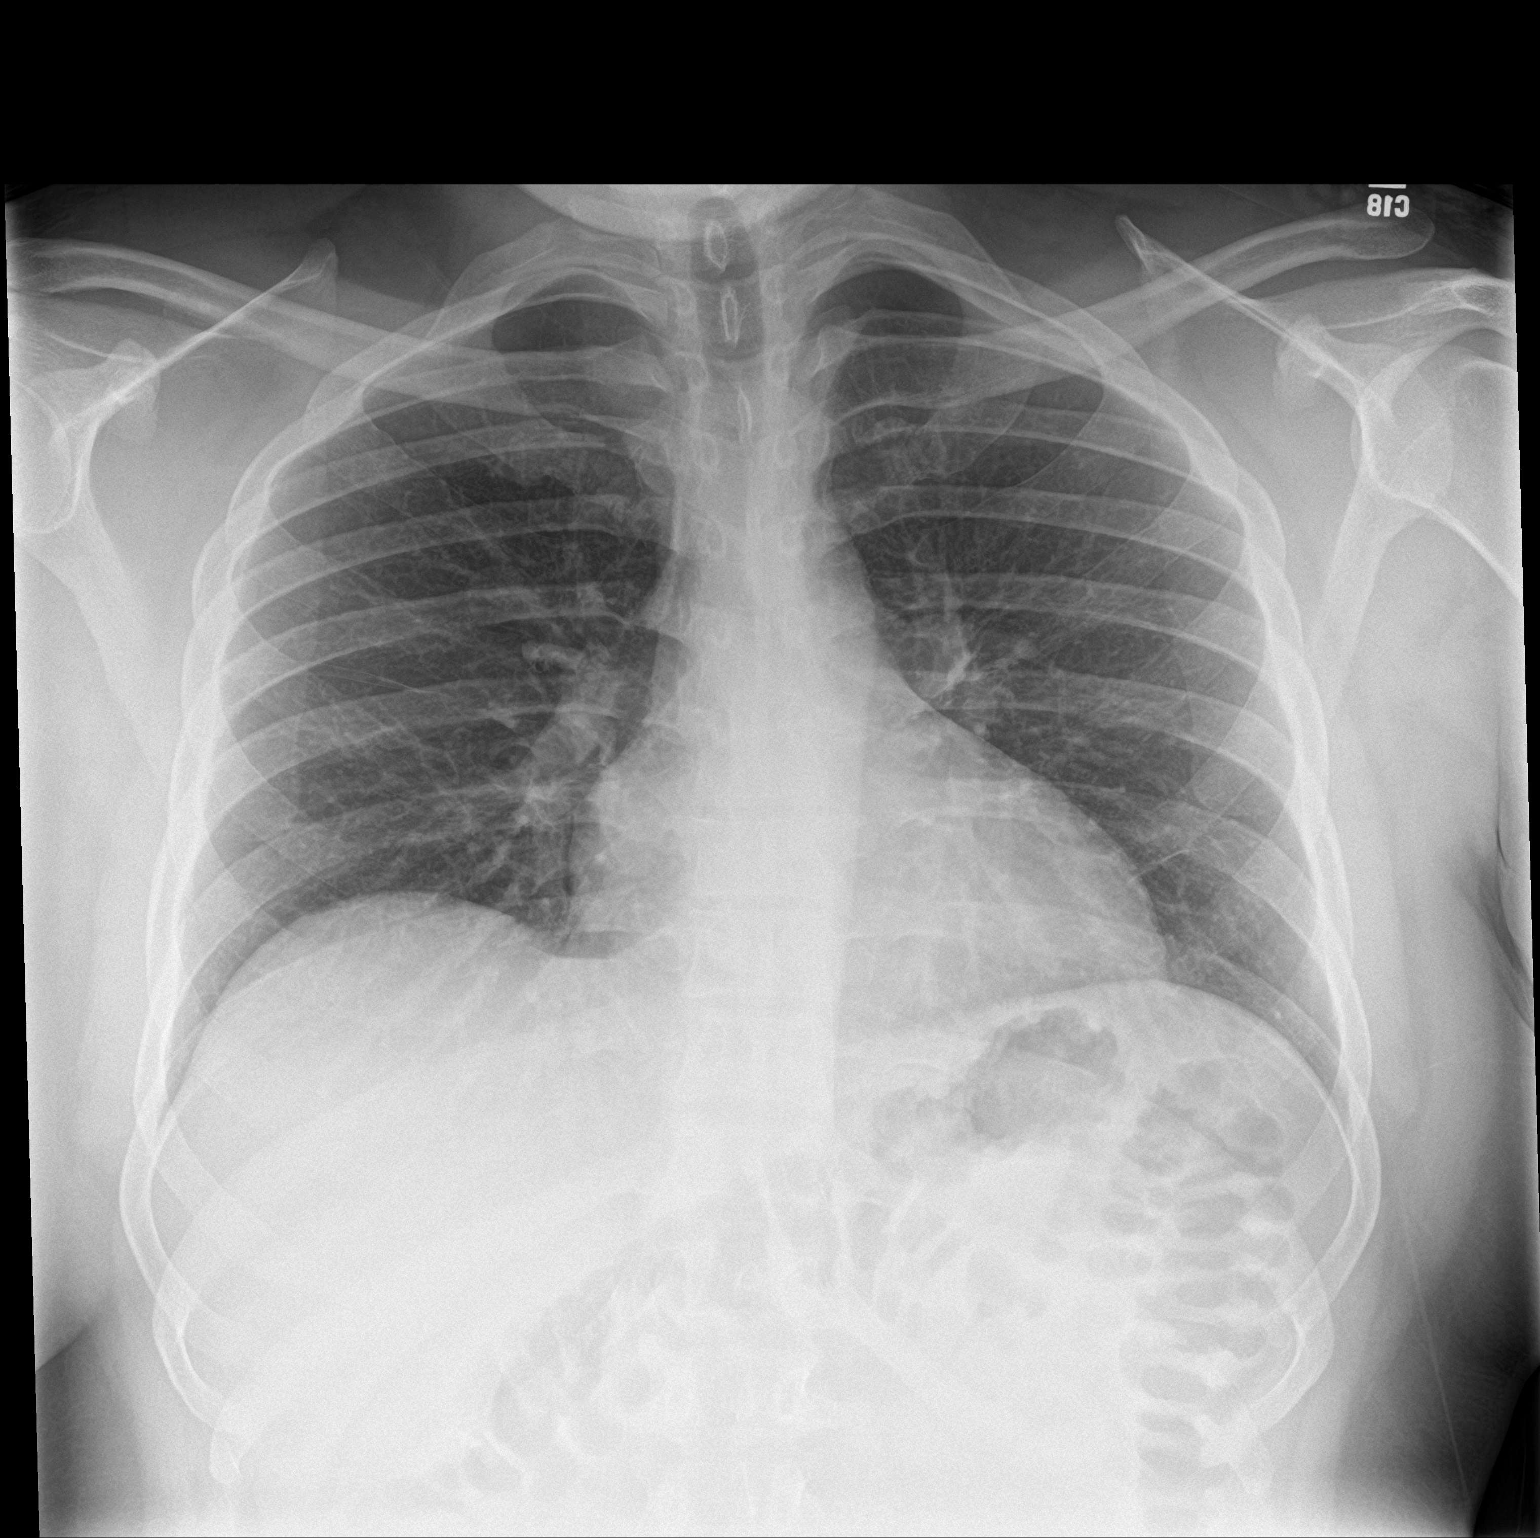

[chest lat]
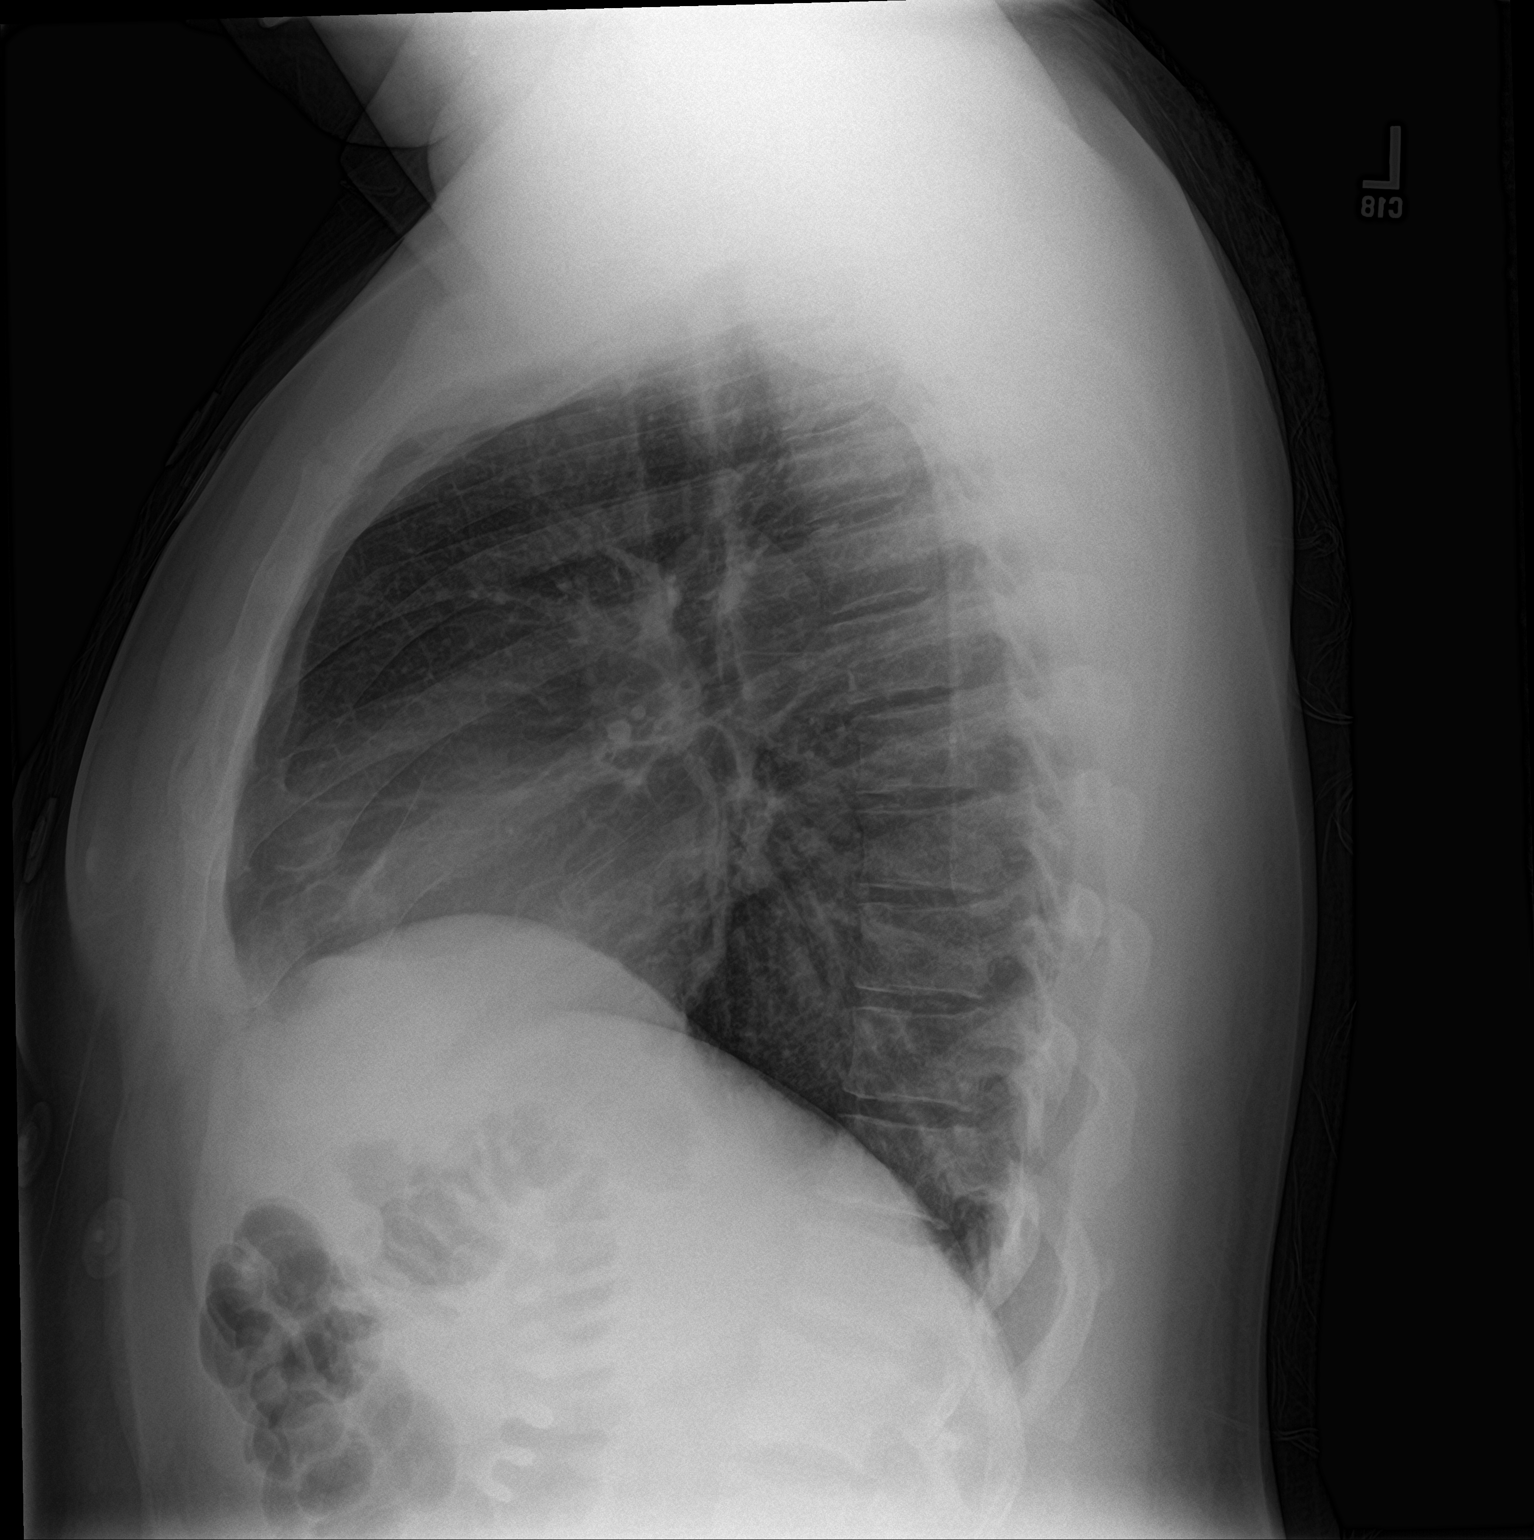

[2 of 2 positions shown; findings below may reference images not displayed]

FINDINGS: The cardiac silhouette, mediastinal and hilar contours are normal.
The lungs are clear. No pleural effusion. No pulmonary lesions. The
bony thorax is intact.
IMPRESSION: No acute cardiopulmonary findings.

## 2022-04-08 IMAGING — DX DG CHEST 2V
2 series · 2 of 2 positions shown · non-contrast
Comparison: Radiograph 12/18/2019

CLINICAL DATA: Central chest pain, tachycardia and shortness of
breath

EXAM:
CHEST - 2 VIEW

[chest pa]
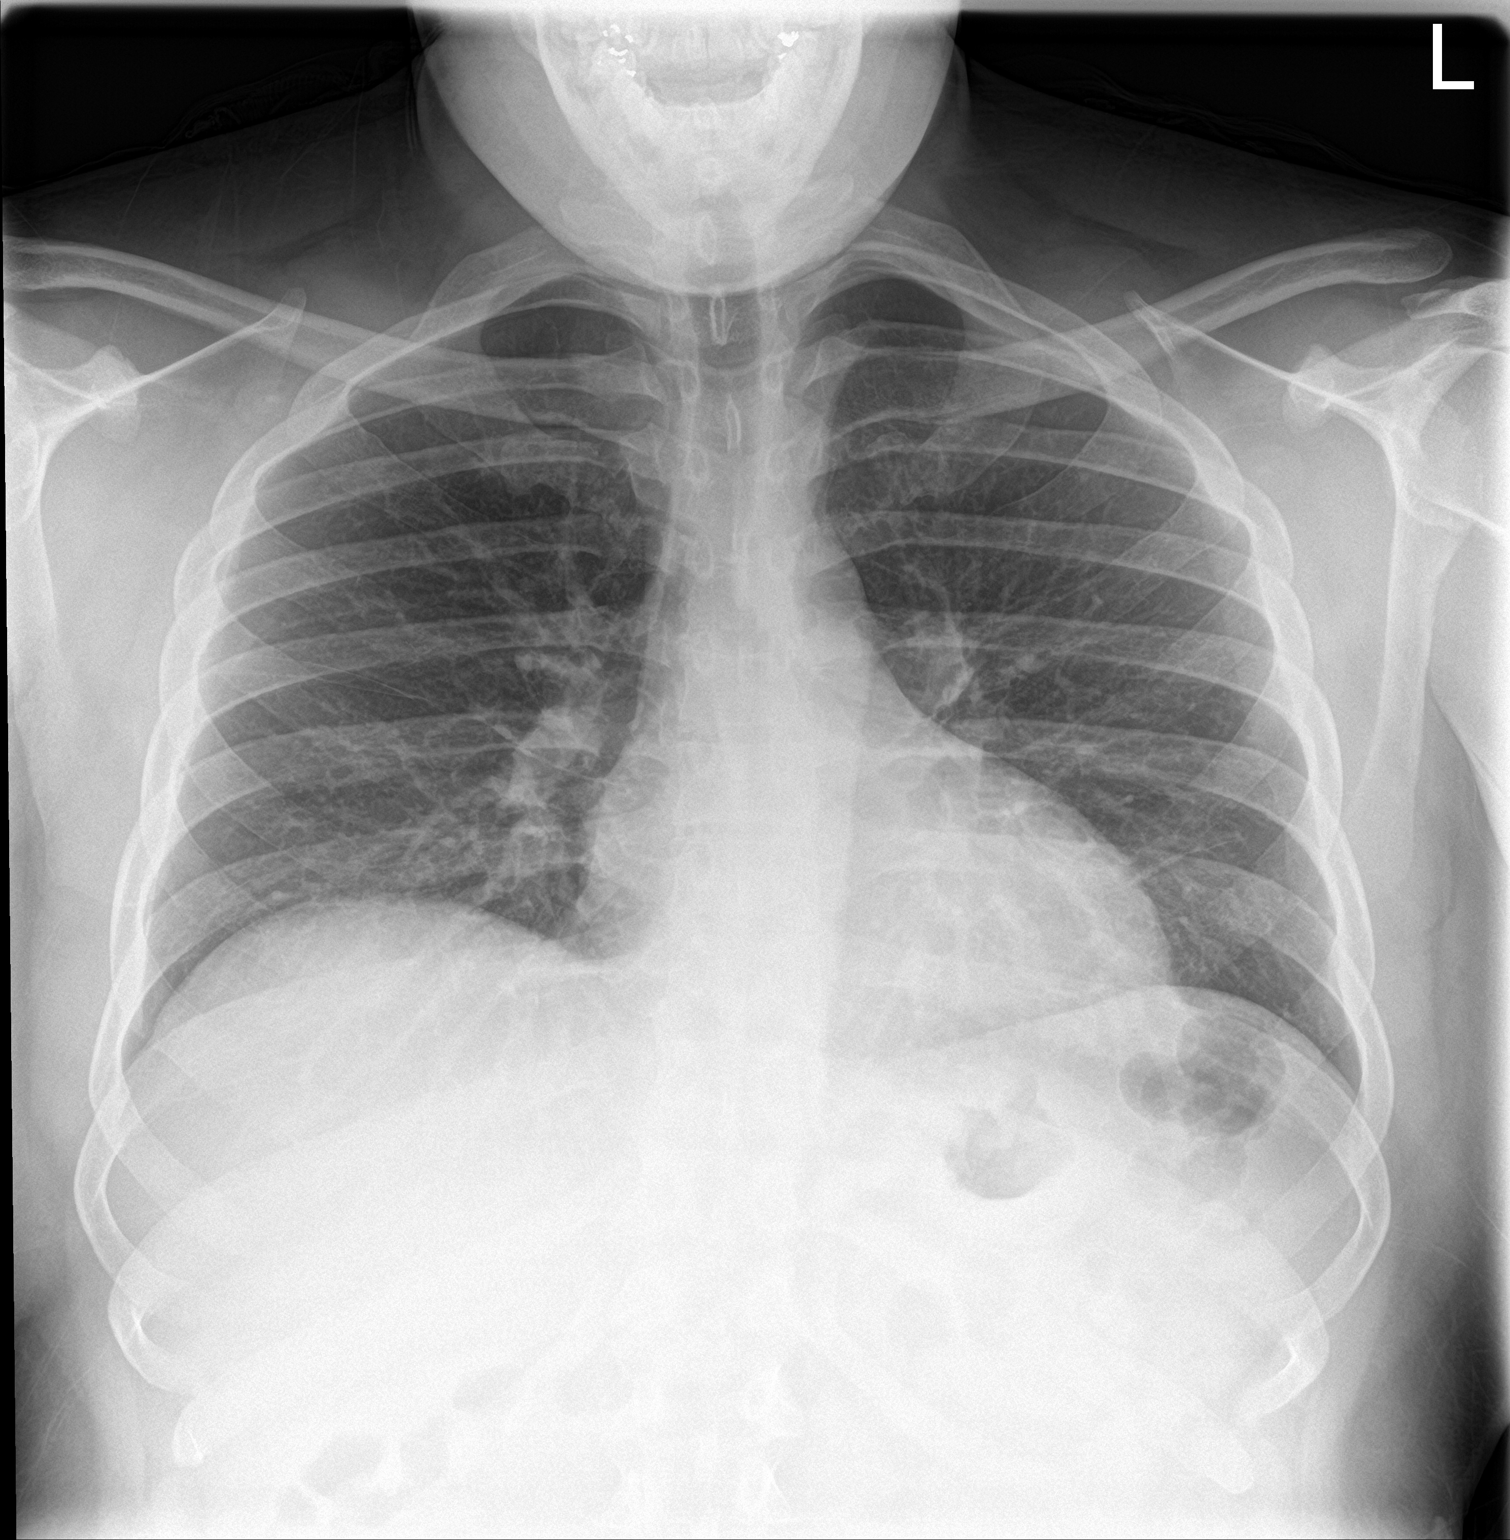

[chest lat]
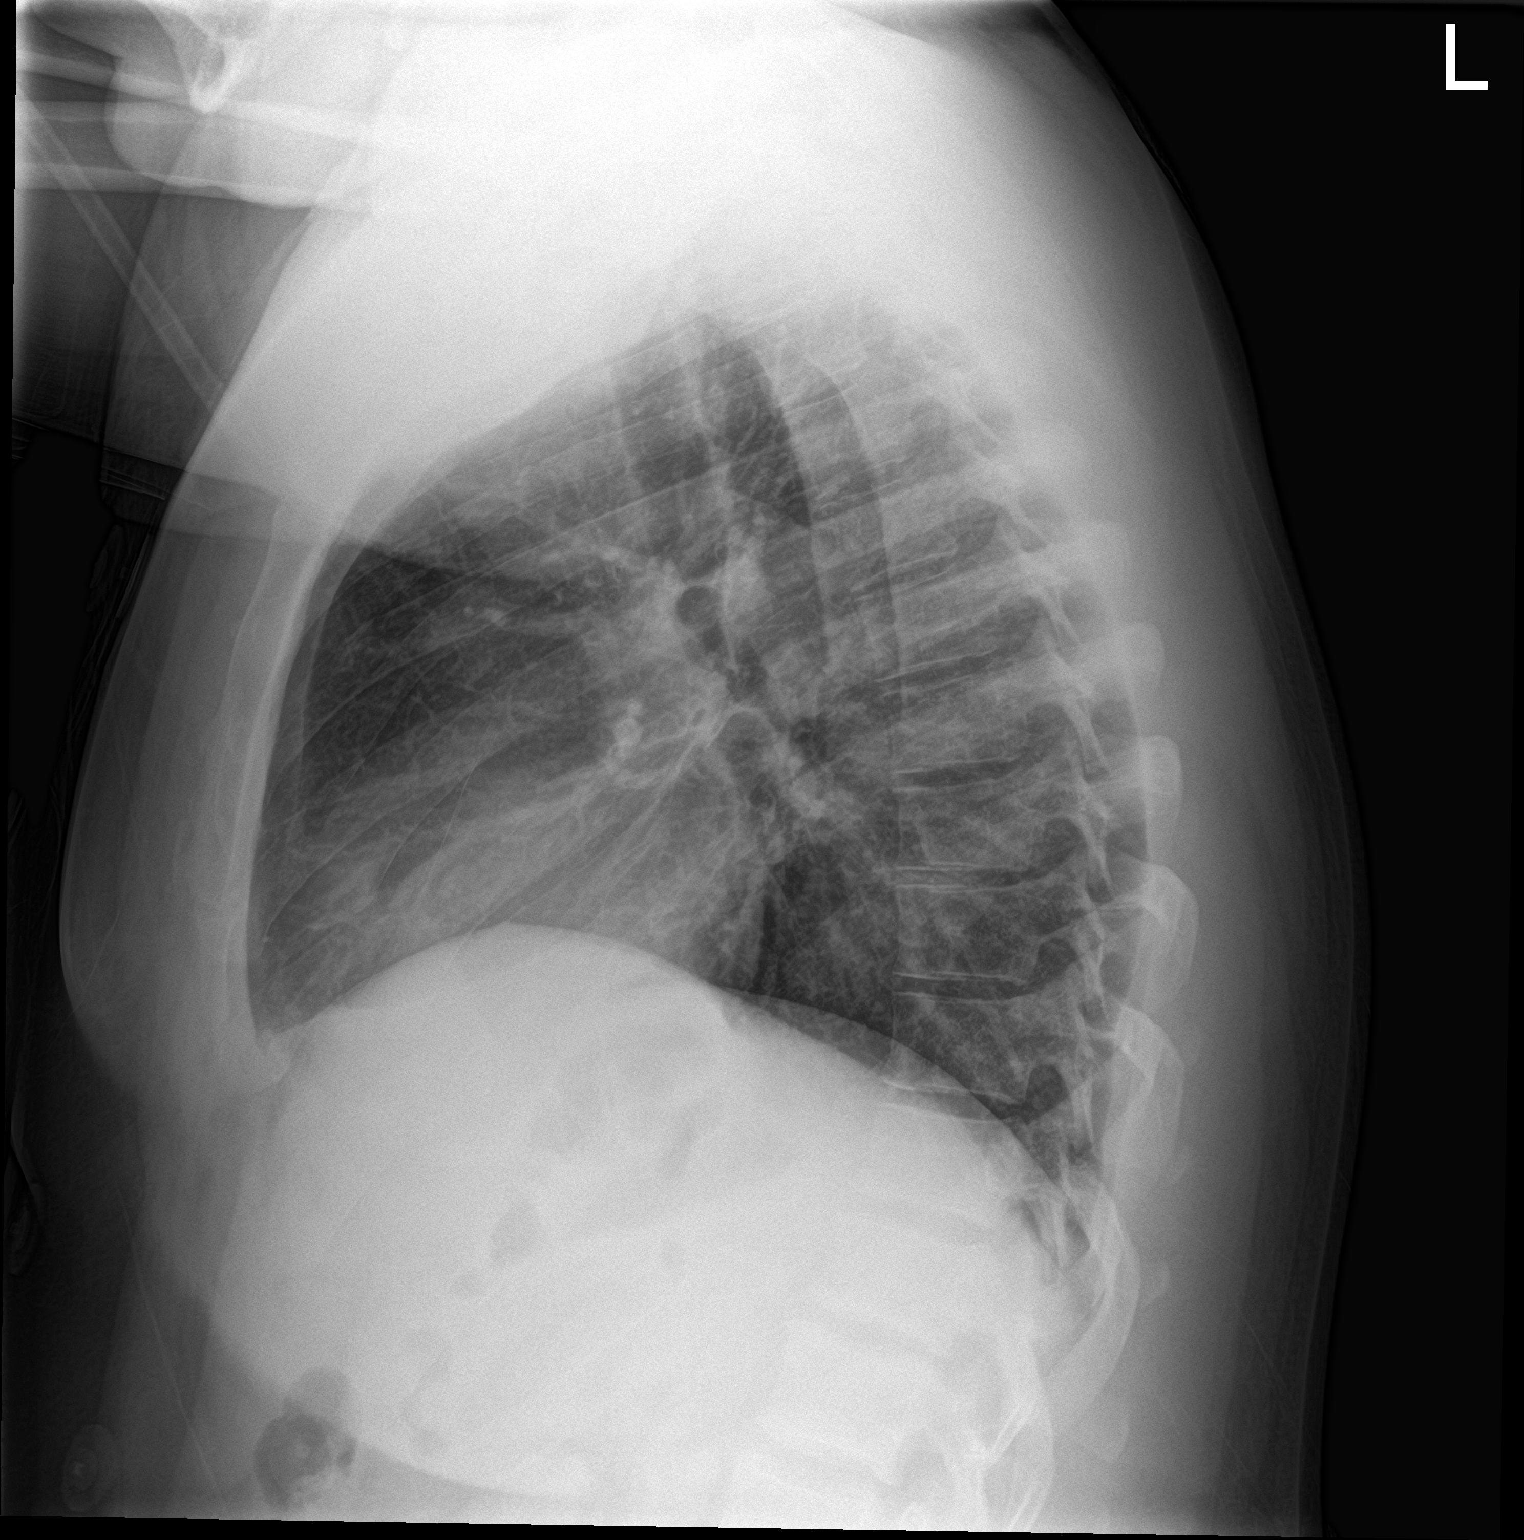

[2 of 2 positions shown; findings below may reference images not displayed]

FINDINGS: Low volumes with streaky atelectasis with central vascular crowding.
No consolidation, features of edema, pneumothorax, or effusion. The
cardiomediastinal contours are unremarkable. No acute osseous or
soft tissue abnormality. Chronic elevation of the left distal
clavicle relative to the acromion may reflect sequela of prior AC
joint injury. The right AC joint is not visualized for comparison.
IMPRESSION: Low volumes with streaky atelectasis with central vascular crowding.

## 2023-03-07 ENCOUNTER — Encounter (HOSPITAL_COMMUNITY): Payer: Self-pay | Admitting: Emergency Medicine

## 2023-03-07 ENCOUNTER — Other Ambulatory Visit: Payer: Self-pay

## 2023-03-07 ENCOUNTER — Ambulatory Visit (HOSPITAL_COMMUNITY)
Admission: EM | Admit: 2023-03-07 | Discharge: 2023-03-07 | Disposition: A | Discharge status: Home / Self Care | Payer: Self-pay | Attending: Emergency Medicine | Admitting: Emergency Medicine

## 2023-03-07 DIAGNOSIS — R519 Headache, unspecified: Secondary | ICD-10-CM

## 2023-03-07 MED ORDER — KETOROLAC TROMETHAMINE 30 MG/ML IJ SOLN
30.0000 mg | Freq: Once | INTRAMUSCULAR | Status: AC
  Administered 2023-03-07: 30 mg via INTRAMUSCULAR

## 2023-03-07 MED ORDER — KETOROLAC TROMETHAMINE 30 MG/ML IJ SOLN
INTRAMUSCULAR | Status: AC
  Filled 2023-03-07: qty 1

## 2023-03-07 MED ORDER — DEXAMETHASONE SODIUM PHOSPHATE 10 MG/ML IJ SOLN
10.0000 mg | Freq: Once | INTRAMUSCULAR | Status: AC
  Administered 2023-03-07: 10 mg via INTRAMUSCULAR

## 2023-03-07 MED ORDER — IBUPROFEN 800 MG PO TABS: 800.0000 mg | ORAL_TABLET | Freq: Three times a day (TID) | ORAL | 0 refills | Status: AC

## 2023-03-07 MED ORDER — DEXAMETHASONE SODIUM PHOSPHATE 10 MG/ML IJ SOLN
INTRAMUSCULAR | Status: AC
  Filled 2023-03-07: qty 1

## 2023-03-07 NOTE — Discharge Instructions (Signed)
For your headache -It is possible that this is related to stress please take tomorrow to rest -On exam there are no abnormalities neurologically -You have been given an injection of Toradol and Decadron here in the office today to help minimize your symptoms -You may continue use of ibuprofen and tylenol for management at home do not overuse medicines as it can cause irritation to your kidney and liver -While headaches are present ensure that you are getting adequate rest and adequate fluid intake -Participate in low stimulation activities avoiding bright lights and loud noises when symptoms are present -At any point if you have the worst headache of your life please go to the nearest emergency department for immediate evaluation

## 2023-03-07 NOTE — ED Triage Notes (Signed)
Right side of head pain, "pounding".  Patient has been taking bc powders, tylenol.    Patient reports this started Sunday night.  Patient is under a lot of stress.  Denies runny nose or cough, does not think ear is the issue, does not think it is dental related

## 2023-03-07 NOTE — ED Provider Notes (Signed)
[facility]    CSN: [id number] Arrival date & time: 03/07/23  1433      History   Chief Complaint Chief Complaint  Patient presents with   Headache    HPI Keith Foster is a 32 y.o. male.    Patient presents for evaluation of a right-sided headache radiating to the posterior of the scalp present for 5 days.  Symptoms started abruptly while working and has been constant since.  Pain is described as a throbbing and pulsating sensation fluctuating in severity but is always severe.  Exacerbated by light and loud noise.  Interfering with sleep.  Headache to the occurred in the past.  He has attempted use of Excedrin, Tylenol, NyQuil, BC powder ineffective.  Denies dizziness, lightheadedness, visual disturbance.   Endorses high levels of stress at work     History reviewed. No pertinent past medical history.  There are no problems to display for this patient.   Past Surgical History:  Procedure Laterality Date   ANKLE SURGERY         Home Medications    Prior to Admission medications   Medication Sig Start Date End Date Taking? Authorizing Provider  metoprolol tartrate (LOPRESSOR) 50 MG tablet Take 1/2 to 1 tablet twice daily for your fast heart rate. Patient not taking: Reported on 03/07/2023 12/19/19   Mardella Layman, MD  diphenhydrAMINE (BENADRYL) 25 MG tablet Take 1 tablet (25 mg total) by mouth every 6 (six) hours as needed for itching (Rash). 07/31/16 02/17/20  Garlon Hatchet, PA-C    Family History Family History  Problem Relation Age of Onset   Healthy Mother    Healthy Father     Social History Social History   Tobacco Use   Smoking status: Former    Current packs/day: 0.25    Average packs/day: 0.3 packs/day for 5.0 years (1.3 ttl pk-yrs)    Types: Cigars, Cigarettes   Smokeless tobacco: Never  Vaping Use   Vaping status: Never Used  Substance Use Topics   Alcohol use: Yes   Drug use: No     Allergies   Banana   Review of  Systems Review of Systems  Constitutional: Negative.   HENT: Negative.    Respiratory: Negative.    Cardiovascular: Negative.   Skin: Negative.   Neurological:  Positive for headaches. Negative for dizziness, tremors, seizures, syncope, facial asymmetry, speech difficulty, weakness, light-headedness and numbness.     Physical Exam Triage Vital Signs ED Triage Vitals  Encounter Vitals Group     BP 03/07/23 1550 (!) 151/84     Systolic BP Percentile --      Diastolic BP Percentile --      Pulse Rate 03/07/23 1550 95     Resp 03/07/23 1550 20     Temp 03/07/23 1550 98.5 F (36.9 C)     Temp Source 03/07/23 1550 Oral     SpO2 03/07/23 1550 95 %     Weight --      Height --      Head Circumference --      Peak Flow --      Pain Score 03/07/23 1547 9     Pain Loc --      Pain Education --      Exclude from Growth Chart --    No data found.  Updated Vital Signs BP (!) 151/84 (BP Location: Left Arm) Comment (BP Location): large cuff  Pulse 95   Temp 98.5 F (36.9 C) (  Oral)   Resp 20   SpO2 95%   Visual Acuity Right Eye Distance:   Left Eye Distance:   Bilateral Distance:    Right Eye Near:   Left Eye Near:    Bilateral Near:     Physical Exam Constitutional:      Appearance: Normal appearance. He is well-developed.  HENT:     Right Ear: Tympanic membrane, ear canal and external ear normal.     Left Ear: Tympanic membrane, ear canal and external ear normal.  Eyes:     Extraocular Movements: Extraocular movements intact.     Conjunctiva/sclera: Conjunctivae normal.     Pupils: Pupils are equal, round, and reactive to light.  Pulmonary:     Effort: Pulmonary effort is normal.  Neurological:     General: No focal deficit present.     Mental Status: He is alert and oriented to person, place, and time. Mental status is at baseline.     Cranial Nerves: No cranial nerve deficit.     Sensory: No sensory deficit.     Motor: No weakness.     Coordination:  Coordination normal.     Gait: Gait normal.      UC Treatments / Results  Labs (all labs ordered are listed, but only abnormal results are displayed) Labs Reviewed - No data to display  EKG   Radiology No results found.  Procedures Procedures (including critical care time)  Medications Ordered in UC Medications - No data to display  Initial Impression / Assessment and Plan / UC Course  I have reviewed the triage vital signs and the nursing notes.  Pertinent labs & imaging results that were available during my care of the patient were reviewed by me and considered in my medical decision making (see chart for details).  Bad headache  Vital signs are stable, while visibly uncomfortable, No signs of distress, no neurological deficits noted on exam, possibly stress-induced, discussed this with patient, Toradol and Decadron injections given in office, prescribed ibuprofen 800 mg and recommended rest with low stimulation activities until symptoms have resolved, given strict precautions for headache worsening in severity" appearance to go to the nearest emergency department for reevaluation, may return to urgent care as needed Final Clinical Impressions(s) / UC Diagnoses   Final diagnoses:  None   Discharge Instructions   None    ED Prescriptions   None    PDMP not reviewed this encounter.   Valinda Hoar, NP 03/07/23 1646

## 2023-12-28 ENCOUNTER — Emergency Department (HOSPITAL_BASED_OUTPATIENT_CLINIC_OR_DEPARTMENT_OTHER)
Admission: EM | Admit: 2023-12-28 | Discharge: 2023-12-28 | Disposition: A | Discharge status: Home / Self Care | Payer: MEDICAID | Attending: Emergency Medicine | Admitting: Emergency Medicine

## 2023-12-28 ENCOUNTER — Other Ambulatory Visit: Payer: Self-pay

## 2023-12-28 ENCOUNTER — Emergency Department (HOSPITAL_BASED_OUTPATIENT_CLINIC_OR_DEPARTMENT_OTHER): Payer: MEDICAID | Admitting: Radiology

## 2023-12-28 ENCOUNTER — Encounter (HOSPITAL_BASED_OUTPATIENT_CLINIC_OR_DEPARTMENT_OTHER): Payer: Self-pay | Admitting: Emergency Medicine

## 2023-12-28 DIAGNOSIS — D72829 Elevated white blood cell count, unspecified: Secondary | ICD-10-CM | POA: Insufficient documentation

## 2023-12-28 DIAGNOSIS — H00011 Hordeolum externum right upper eyelid: Secondary | ICD-10-CM

## 2023-12-28 DIAGNOSIS — R55 Syncope and collapse: Secondary | ICD-10-CM | POA: Insufficient documentation

## 2023-12-28 DIAGNOSIS — S4992XA Unspecified injury of left shoulder and upper arm, initial encounter: Secondary | ICD-10-CM

## 2023-12-28 DIAGNOSIS — Z7982 Long term (current) use of aspirin: Secondary | ICD-10-CM | POA: Insufficient documentation

## 2023-12-28 MED ORDER — NAPROXEN 375 MG PO TABS: 375.0000 mg | ORAL_TABLET | Freq: Two times a day (BID) | ORAL | 0 refills | Status: AC

## 2023-12-28 MED ORDER — DOXYCYCLINE HYCLATE 100 MG PO CAPS: 100.0000 mg | ORAL_CAPSULE | Freq: Two times a day (BID) | ORAL | 0 refills | Status: DC

## 2023-12-28 MED ORDER — KETOROLAC TROMETHAMINE 60 MG/2ML IM SOLN
60.0000 mg | Freq: Once | INTRAMUSCULAR | Status: AC
  Administered 2023-12-28: 60 mg via INTRAMUSCULAR
  Filled 2023-12-28: qty 2

## 2023-12-28 MED ORDER — OFLOXACIN 0.3 % OP SOLN
1.0000 [drp] | Freq: Four times a day (QID) | OPHTHALMIC | Status: DC
  Administered 2023-12-28: 1 [drp] via OPHTHALMIC
  Filled 2023-12-28: qty 5

## 2023-12-28 MED ORDER — DOXYCYCLINE HYCLATE 100 MG PO CAPS: 100.0000 mg | ORAL_CAPSULE | Freq: Two times a day (BID) | ORAL | 0 refills | Status: AC

## 2023-12-28 NOTE — Discharge Instructions (Signed)
 Contact a health care provider if: You have chills or a fever. Your stye does not go away after several days. Your stye affects your vision. Your eyeball becomes swollen, red, or painful. Get help right away if: You have pain when moving your eye around. You have any new or worsening symptoms with your left shoulder

## 2023-12-28 NOTE — ED Triage Notes (Signed)
 Pt dislocated left shoulder over a week ago, popped back in with a friend. He then continued to work out/lift weights/play basketball. Now he can't move his left shoulder ,he can move his forearm.  Also right eye red/draining/burning/swollen since Wednesday.

## 2023-12-28 NOTE — ED Notes (Signed)
 Shoulder sling applied to Left arm. Pt tolerated well.

## 2023-12-28 NOTE — ED Provider Notes (Signed)
 [place] [facility]MENT AT [facility] Provider Note   CSN: [id number] Arrival date & time: 12/28/23  1535     History  No chief complaint on file.   Keith Foster is a 33 y.o. male who presents emergency department for chief complaint of left shoulder injury/pain and right eye pain.  Patient reports that a week and a half ago he was playing basketball when he felt like his shoulder popped out.  He had a friend grabbed his wrist and pull really hard.  It felt like it moved back in and he felt significantly better.  The next day he woke up with a ton of pain and inability to really move the joint.  He bought an over-the-counter shoulder sling and things seem to improved.  Patient later began trying to play more basketball and lift weights and since that time has had multiple war injuries and is now in severe pain in the left arm with any movement.  He denies numbness or tingling he is right-hand dominant. Patient also noticed a little bit of redness and swelling at the medial corner of his eye.  Since that time he has had increased burning and discomfort in the lid of his eye.  He has noticed some crusting on the medial part of his eye.  He denies vision changes eye injury and does not wear contacts lenses.  HPI     Home Medications Prior to Admission medications   Medication Sig Start Date End Date Taking? Authorizing Provider  ibuprofen  (ADVIL ) 800 MG tablet Take 1 tablet (800 mg total) by mouth 3 (three) times daily. 03/07/23   Reena Canning, NP  metoprolol  tartrate (LOPRESSOR ) 50 MG tablet Take 1/2 to 1 tablet twice daily for your fast heart rate. Patient not taking: Reported on 03/07/2023 12/19/19   Afton Albright, MD  diphenhydrAMINE  (BENADRYL ) 25 MG tablet Take 1 tablet (25 mg total) by mouth every 6 (six) hours as needed for itching (Rash). 07/31/16 02/17/20  Coretha Dew, PA-C      Allergies    Banana    Review of Systems   Review of Systems  Physical  Exam Updated Vital Signs BP (!) 143/101   Pulse 73   Temp 98 F (36.7 C) (Oral)   Resp 20   Wt 108 kg   SpO2 100%   BMI 32.29 kg/m  Physical Exam Vitals and nursing note reviewed.  Constitutional:      General: He is not in acute distress.    Appearance: He is well-developed. He is not diaphoretic.  HENT:     Head: Normocephalic and atraumatic.     Comments: Swelling of the Upper E eyelid with palpable tenderness on the medial section there is a little bit of purulent discharge from the upper lid. Eyes:     General: No scleral icterus.    Extraocular Movements: Extraocular movements intact.     Conjunctiva/sclera: Conjunctivae normal.     Pupils: Pupils are equal, round, and reactive to light.  Cardiovascular:     Rate and Rhythm: Normal rate and regular rhythm.     Heart sounds: Normal heart sounds.  Pulmonary:     Effort: Pulmonary effort is normal. No respiratory distress.     Breath sounds: Normal breath sounds.  Abdominal:     Palpations: Abdomen is soft.     Tenderness: There is no abdominal tenderness.  Musculoskeletal:     Cervical back: Normal range of motion and neck supple.  Comments: Significant pain with any passive or active movement of the left arm.  Normal radial pulse noted.  Skin:    General: Skin is warm and dry.  Neurological:     Mental Status: He is alert.  Psychiatric:        Behavior: Behavior normal.     ED Results / Procedures / Treatments   Labs (all labs ordered are listed, but only abnormal results are displayed) Labs Reviewed - No data to display  EKG None  Radiology DG Shoulder Left Result Date: 12/28/2023 CLINICAL DATA:  Left shoulder pain following a dislocation over 1 week ago. He and a friend were able to relocated it. Now can not move his left shoulder. EXAM: LEFT SHOULDER - 2+ VIEW COMPARISON:  11/07/2009 FINDINGS: Stable superior dislocation of the distal clavicle relative to the acromion. No glenohumeral dislocation. No  fractures. IMPRESSION: 1. No fracture or acute dislocation. 2. 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 Electronically Signed   By: Catherin Closs M.D.   On: 12/28/2023 17:30    Procedures Procedures    Medications Ordered in ED Medications  ofloxacin  (OCUFLOX ) 0.3 % ophthalmic solution 1 drop (1 drop Right Eye Given 12/28/23 1726)  ketorolac  (TORADOL ) injection 60 mg (60 mg Intramuscular Given 12/28/23 1723)    ED Course/ Medical Decision Making/ A&P                                 Medical Decision Making Amount and/or Complexity of Data Reviewed Radiology: ordered.  Risk Prescription drug management.   Patient here with left shoulder pain and right eye discomfort.  I ordered visualized and interpreted left shoulder x-ray which shows chronic dislocation of the acromioclavicular joint.  Patient provided sling for comfort.  His eye has swelling and discharge from the upper lid.  He also has a bit of erythema we will treat with ofloxacin  drops.  Given Toradol  injection for his shoulder pain which was significantly helpful he will need to follow-up with orthopedics.  Discussed outpatient follow-up and return precautions. .       Final Clinical Impression(s) / ED Diagnoses Final diagnoses:  None    Rx / DC Orders ED Discharge Orders     None         Tama Fails, PA-C 01/02/24 Lus Salter, MD 01/03/24 1244

## 2023-12-28 NOTE — ED Notes (Signed)
Reviewed discharge instructions, medications, and home care with pt. Pt verbalized understanding and had no further questions. Pt exited ED without complications.

## 2024-07-24 ENCOUNTER — Other Ambulatory Visit: Payer: Self-pay

## 2024-07-24 ENCOUNTER — Emergency Department (HOSPITAL_COMMUNITY)
Admission: EM | Admit: 2024-07-24 | Discharge: 2024-07-24 | Discharge status: Against Medical Advice | Payer: MEDICAID | Attending: Emergency Medicine | Admitting: Emergency Medicine

## 2024-07-24 DIAGNOSIS — Z5321 Procedure and treatment not carried out due to patient leaving prior to being seen by health care provider: Secondary | ICD-10-CM | POA: Insufficient documentation

## 2024-07-24 DIAGNOSIS — T7840XA Allergy, unspecified, initial encounter: Secondary | ICD-10-CM | POA: Insufficient documentation

## 2024-07-24 NOTE — ED Triage Notes (Signed)
 Patient states that he had a smoothie that had bananas in it, which he is allergic to. He states he had the smoothie at 10am and he started having shob and he feels like his skin is crawling. He is speaking in full sentences with a clear voice at quick triage desk.   He states he took benadryl  at around 1120am.

## 2024-07-24 NOTE — ED Triage Notes (Signed)
 Patient reports improvement since taking Benadryl  Claritin, only symptom remaining is skin crawling. Denies SOB. Came to ED at family's insistence.
